# Patient Record
Sex: Male | Born: 1950
Health system: Southern US, Community
[De-identification: ages and names within clinical notes are randomized; demographics above are authoritative.]

## PROBLEM LIST (undated history)

## (undated) DIAGNOSIS — Z87442 Personal history of urinary calculi: Secondary | ICD-10-CM

## (undated) DIAGNOSIS — M1990 Unspecified osteoarthritis, unspecified site: Secondary | ICD-10-CM

## (undated) DIAGNOSIS — Z8739 Personal history of other diseases of the musculoskeletal system and connective tissue: Secondary | ICD-10-CM

## (undated) DIAGNOSIS — K219 Gastro-esophageal reflux disease without esophagitis: Secondary | ICD-10-CM

## (undated) DIAGNOSIS — I10 Essential (primary) hypertension: Secondary | ICD-10-CM

## (undated) DIAGNOSIS — C61 Malignant neoplasm of prostate: Secondary | ICD-10-CM

## (undated) DIAGNOSIS — E785 Hyperlipidemia, unspecified: Secondary | ICD-10-CM

## (undated) HISTORY — PX: SHOULDER SURGERY: SHX246

## (undated) HISTORY — PX: OTHER SURGICAL HISTORY: SHX169

## (undated) HISTORY — PX: TONSILLECTOMY: SUR1361

---

## 2001-07-18 ENCOUNTER — Ambulatory Visit (HOSPITAL_COMMUNITY): Admission: RE | Admit: 2001-07-18 | Discharge: 2001-07-18 | Payer: Self-pay

## 2001-07-19 ENCOUNTER — Ambulatory Visit (HOSPITAL_COMMUNITY): Admission: RE | Admit: 2001-07-19 | Discharge: 2001-07-19 | Payer: Self-pay

## 2001-09-09 ENCOUNTER — Ambulatory Visit (HOSPITAL_COMMUNITY): Admission: RE | Admit: 2001-09-09 | Discharge: 2001-09-09 | Payer: Self-pay | Admitting: Orthopaedic Surgery

## 2001-09-09 ENCOUNTER — Encounter: Payer: Self-pay | Admitting: Orthopaedic Surgery

## 2002-02-25 ENCOUNTER — Ambulatory Visit (HOSPITAL_COMMUNITY): Admission: RE | Admit: 2002-02-25 | Discharge: 2002-02-25 | Payer: Self-pay | Admitting: Orthopaedic Surgery

## 2002-02-25 ENCOUNTER — Encounter: Payer: Self-pay | Admitting: Orthopaedic Surgery

## 2002-10-01 ENCOUNTER — Encounter: Payer: Self-pay | Admitting: Orthopaedic Surgery

## 2002-10-01 ENCOUNTER — Ambulatory Visit (HOSPITAL_COMMUNITY): Admission: RE | Admit: 2002-10-01 | Discharge: 2002-10-01 | Payer: Self-pay | Admitting: Orthopaedic Surgery

## 2002-11-24 ENCOUNTER — Ambulatory Visit (HOSPITAL_COMMUNITY): Admission: RE | Admit: 2002-11-24 | Discharge: 2002-11-24 | Payer: Self-pay | Admitting: Internal Medicine

## 2009-04-13 ENCOUNTER — Encounter (INDEPENDENT_AMBULATORY_CARE_PROVIDER_SITE_OTHER): Payer: Self-pay | Admitting: *Deleted

## 2009-04-13 DIAGNOSIS — I10 Essential (primary) hypertension: Secondary | ICD-10-CM | POA: Insufficient documentation

## 2009-04-13 DIAGNOSIS — E119 Type 2 diabetes mellitus without complications: Secondary | ICD-10-CM

## 2009-04-20 ENCOUNTER — Ambulatory Visit: Payer: Self-pay | Admitting: Internal Medicine

## 2009-04-20 DIAGNOSIS — T8450XA Infection and inflammatory reaction due to unspecified internal joint prosthesis, initial encounter: Secondary | ICD-10-CM

## 2009-04-20 DIAGNOSIS — A4901 Methicillin susceptible Staphylococcus aureus infection, unspecified site: Secondary | ICD-10-CM | POA: Insufficient documentation

## 2009-04-20 DIAGNOSIS — M1990 Unspecified osteoarthritis, unspecified site: Secondary | ICD-10-CM | POA: Insufficient documentation

## 2009-04-20 LAB — CONVERTED CEMR LAB
BUN: 11 mg/dL (ref 6–23)
CO2: 26 meq/L (ref 19–32)
CRP: 0.3 mg/dL (ref <0.6)
Calcium: 9.6 mg/dL (ref 8.4–10.5)
Chloride: 103 meq/L (ref 96–112)
Creatinine, Ser: 1.29 mg/dL (ref 0.40–1.50)
Glucose, Bld: 137 mg/dL — ABNORMAL HIGH (ref 70–99)
HCT: 43.5 % (ref 39.0–52.0)
Hemoglobin: 14.7 g/dL (ref 13.0–17.0)
MCHC: 33.8 g/dL (ref 30.0–36.0)
MCV: 86 fL (ref 78.0–100.0)
Platelets: 236 10*3/uL (ref 150–400)
Potassium: 4.3 meq/L (ref 3.5–5.3)
RBC: 5.06 M/uL (ref 4.22–5.81)
RDW: 14.8 % (ref 11.5–15.5)
Sed Rate: 2 mm/hr (ref 0–16)
Sodium: 140 meq/L (ref 135–145)
WBC: 6.9 10*3/uL (ref 4.0–10.5)

## 2009-05-04 ENCOUNTER — Ambulatory Visit: Payer: Self-pay | Admitting: Internal Medicine

## 2009-05-04 LAB — CONVERTED CEMR LAB
CRP: 0.4 mg/dL (ref <0.6)
Sed Rate: 6 mm/hr (ref 0–16)

## 2009-05-19 ENCOUNTER — Telehealth: Payer: Self-pay | Admitting: Internal Medicine

## 2009-07-06 ENCOUNTER — Ambulatory Visit: Payer: Self-pay | Admitting: Internal Medicine

## 2009-08-04 ENCOUNTER — Telehealth (INDEPENDENT_AMBULATORY_CARE_PROVIDER_SITE_OTHER): Payer: Self-pay | Admitting: *Deleted

## 2009-09-10 ENCOUNTER — Encounter: Payer: Self-pay | Admitting: Internal Medicine

## 2009-10-20 ENCOUNTER — Encounter: Payer: Self-pay | Admitting: Internal Medicine

## 2009-11-09 ENCOUNTER — Ambulatory Visit: Payer: Self-pay | Admitting: Internal Medicine

## 2009-11-09 LAB — CONVERTED CEMR LAB
CRP: 1.1 mg/dL — ABNORMAL HIGH (ref <0.6)
Sed Rate: 2 mm/hr (ref 0–16)

## 2009-11-22 ENCOUNTER — Telehealth: Payer: Self-pay | Admitting: Internal Medicine

## 2010-02-01 NOTE — Consult Note (Signed)
Summary: [facility] Ortho. Clinic Note  [facility] Ortho. Clinic Note   Imported By: Florinda Marker 09/24/2009 09:32:31  _____________________________________________________________________  External Attachment:    Type:   Image     Comment:   External Document

## 2010-02-01 NOTE — Consult Note (Signed)
Summary: [facility]  [facility]   Imported By: Florinda Marker 11/05/2009 15:07:11  _____________________________________________________________________  External Attachment:    Type:   Image     Comment:   External Document

## 2010-02-01 NOTE — Progress Notes (Signed)
  Phone Note From Other Clinic   Caller: Dr. Loretha Stapler Summary of Call: I spoke with Dr. Lorette Ang about mr. Sauls's difficult situation. I will watch him carefully off Abs. If he relapses I will reconsider a trial of longterm suppressive Abs vs complex surgery. Initial call taken by: Cliffton Asters MD,  November 22, 2009 2:27 PM

## 2010-02-01 NOTE — Progress Notes (Signed)
Summary: Prior authorization not needed for Florastor  Phone Note From Pharmacy   [facility] [address] [phone]* Request: Needs authorization from insurer Summary of Call: received a fax prior authorization request for patient's Florastor 250mg  that is not covered by patient insurance plan.  Please have MD to call [facility] at [phone] to initate the prior authorization.  [facility] with the decision the insurance gave whether approved or denied.  Initial call taken by: Paulo Fruit  BS,CPht II,MPH,  May 19, 2009 10:25 AM  Follow-up for Phone Call        contacted patients insurance using the info the pharmacy sent to [place] and it is for workmanscomp claims.    I contacted the pharmacy to let them know they ran medciation under workmanscomp.  I spoke to [place], [place] who is going to research it and call or fax [place] something else to get this resolved. Follow-up by: Paulo Fruit  BS,CPht II,MPH,  May 19, 2009 10:36 AM  Additional Follow-up for Phone Call Additional follow up Details #1::        Scott the pharmacist did leave me a message this afternoon stating that insurance on file for him is workmanscomp disablility which will not pay for this medicaiton.  Do you want to change it to anything different or have patient try to obtain it on his on.  There is no program for this drug. As for now the pharmacist is going to fill as is. Additional Follow-up by: Paulo Fruit  BS,CPht II,MPH,  May 19, 2009 2:40 PM    Additional Follow-up for Phone Call Additional follow up Details #2::    Pt. called.  Requested that office contact Camillo Flaming w/ Lucienne Capers, [employer]. to discuss why Dr. Orvan Falconer prescribed Florastor.  RN spoke w/ Mr. Demchak [phone]].  He agreed that a copy of Dr. Blair Dolphin OV notes describing the need for the medication would be sufficient documentation for his company to review to authorize the medication.  RN faxed to OV notes to Mr.  Demchak [phone]]. Jennet Maduro RN  May 21, 2009 10:07 AM

## 2010-02-01 NOTE — Assessment & Plan Note (Signed)
Summary: new pt osteomeyltiis   CC:  right shoulder VAC dressing since 2/11.  History of Present Illness: Willie Oneal is a 60 year old who works at the [employer] plant in [employer] who is referred to me by Willie Oneal for evaluation of a right shoulder infection.  Willie Oneal injured his right shoulder on the job and required a right shoulder arthroplasty in 1999.  He said he developed a postoperative staph infection and was treated for a prolonged period with some oral antibiotic.  He required a second surgery in 2000 it sounds like an incision and drainage procedure.  He also says that his glenoid was removed during that second surgery.That was followed by a prolonged period of intravenous antibiotics and after that point he appeared to have been cured.  Last October he recalls bumping his right shoulder on a kitchen cabinet.  He developed a large bruise followed by a blood blister that he popped with a sterilized needle.  The area would not heal so he saw the doctor Willie Oneal, his primary care physician and was started on doxycycline.  He thinks he may have been on one other antibiotic as well.  The area failed to get any better and she started to get worse so he was referred to Willie Oneal at the wound center in [place].  Willie Oneal an initial note 02/04/09 describes a draining sinus in his right shoulder. He underwent incision and drainage and he tells me that Willie Oneal said there was a pocket of infection in that tract right down to the prosthesis.  Operative Gram stain and cultures were negative but he was on doxycycline at the time of the surgery.  Postoperatively he has been treated with doxycycline and more recently Septra.  He has had a back wound system placed and says the wound is getting smaller.  He has also been receiving hyperbaric oxygen therapy.  He saw Willie Oneal, his orthopedic surgeon at [facility], last week and recalls being told that there was nothing  that Willie Oneal would do differently at this point.A swab culture of the wound done at the wound Center on February 21 was culture negative again.  Third culture on March 21 grew MRSA and group B strep.  Plain x-rays of the shoulder did not show any evidence of osteomyelitis or loosening of the prosthesis.  A recent bone scan did not indicate any unusual increased uptake suggestive of abscess or osteomyelitis.  Preventive Screening-Counseling & Management  Alcohol-Tobacco     Alcohol drinks/day: 0     Smoking Status: never  Caffeine-Diet-Exercise     Caffeine use/day: yes     Does Patient Exercise: no  Safety-Violence-Falls     Seat Belt Use: yes   Current Allergies (reviewed today): No known allergies  Review of Systems  The patient denies anorexia, fever, weight loss, and weight gain.    Vital Signs:  Patient profile:   59 year old male Height:      70 inches (177.80 cm) Weight:      211.5 pounds (96.14 kg) BMI:     30.46 Temp:     97.7 degrees F (36.50 degrees C) oral Pulse rate:   82 / minute BP sitting:   127 / 80  (left arm) Cuff size:   large  Vitals Entered By: Jennet Maduro RN (April 20, 2009 2:05 PM) CC: right shoulder VAC dressing since 2/11 Is Patient Diabetic? Yes Did you bring your meter with you today?  not checked today Pain Assessment Patient in pain? no      Nutritional Status BMI of > 30 = obese Nutritional Status Detail appetite "good"  Have you ever been in a relationship where you felt threatened, hurt or afraid?not asked today   Does patient need assistance? Functional Status Self care Ambulation Normal   Physical Exam  General:  alert and overweight-appearing.   Lungs:  normal breath sounds, no crackles, and no wheezes.   Heart:  normal rate, regular rhythm, and no murmur.   Extremities:  he has an old healed incision along his right upper arm and a more recent healing incision laterally.  He has a VAC dressing in  place.   Impression & Recommendations:  Problem # 1:  INF&INFLAM REACTION DUE INTRL JOINT PROSTHESIS (ZOX-096.04) Willie Oneal has a complicated wound infection that, by his history involves his right shoulder prosthesis.  Fortunately there is no evidence of osteomyelitis or loosening of the prosthesis.  I am not sure if the MRSA and group B strep are the culprits but I will target them with expanded antibiotic therapy for now. Both are susceptible to clindamycin so I will use that as the primary agent and add rifampin as it probably has synergistic activity against the organisms particularly in the presence of hardware.  Medications Added to Medication List This Visit: 1)  Cleocin 150 Mg Caps (Clindamycin hcl) .... Take 3 capsules by mouth three times a day 2)  Rifadin 300 Mg Caps (Rifampin) .... Take 1 capsule by mouth two times a day  Other Orders: Consultation Level IV (54098) T-Basic Metabolic Panel [id number]) T-CBC No Diff ([id number]) T-C-Reactive Protein ([id number]) T-Sed Rate (Automated) ([id number])  Patient Instructions: 1)  Please schedule a follow-up appointment on May 3 at 9:45 AM.

## 2010-02-01 NOTE — Assessment & Plan Note (Signed)
Summary: f/u ov/vs   CC:  follow-up visit.  History of Present Illness: Willie Oneal is in for his follow-up visit.  He started clindamycin and rifampin two weeks ago and has had no problems tolerating them.  He feels about the same and says that his wound VAC dressing remains about the same size.  He is going to the wound center daily.  Preventive Screening-Counseling & Management  Alcohol-Tobacco     Alcohol drinks/day: 0     Smoking Status: never  Caffeine-Diet-Exercise     Caffeine use/day: yes     Does Patient Exercise: no  Safety-Violence-Falls     Seat Belt Use: yes   Prior Medication List:  CLEOCIN 150 MG CAPS (CLINDAMYCIN HCL) Take 3 capsules by mouth three times a day RIFADIN 300 MG CAPS (RIFAMPIN) Take 1 capsule by mouth two times a day OMEPRAZOLE 20 MG CPDR (OMEPRAZOLE) Take 1 capsule by mouth once a day per PCP ATENOLOL 25 MG TABS (ATENOLOL) Take 1 tablet by mouth once a day METFORMIN HCL 500 MG TABS (METFORMIN HCL) Take 1 tablet by mouth once a day per PCP MULTIVITAMINS  TABS (MULTIPLE VITAMIN) Take 1 tablet by mouth once a day per PCP ZINC 50 MG TABS (ZINC) Take 1 tablet by mouth once a day per PCP SELENIUM 200 MCG TABS (SELENIUM) Take 1 tablet by mouth once a day per PCP   Current Allergies (reviewed today): No known allergies  Vital Signs:  Patient profile:   60 year old male Height:      70 inches (177.80 cm) Weight:      213.5 pounds (97.05 kg) BMI:     30.74 Temp:     97.7 degrees F (36.50 degrees C) oral Pulse rate:   82 / minute BP sitting:   137 / 81  (left arm) Cuff size:   large  Vitals Entered By: Jennet Maduro RN (May 04, 2009 9:44 AM) CC: follow-up visit Is Patient Diabetic? Yes Pain Assessment Patient in pain? no      Nutritional Status BMI of 25 - 29 = overweight  Have you ever been in a relationship where you felt threatened, hurt or afraid?not asked   Does patient need assistance? Functional Status Self care Ambulation  Normal   Physical Exam  General:  alert and overweight-appearing.   Extremities:  he has an old healed incision along his right upper arm and a more recent healing incision laterally.  He has a VAC dressing in place. The visible sponge is about the size of a silver dollar.   Impression & Recommendations:  Problem # 1:  INF&INFLAM REACTION DUE INTRL JOINT PROSTHESIS (ICD-996.66) I am treating him for presumed MRSA and group B strep infection of his right shoulder prosthesis.  I will add a Florastor probiotic therapy to his antibiotic regimen in an attempt to decrease his risk for C. difficile colitis while on antibiotics.  I will check a sed rate and C. reactive protein today. The exact optimal duration of antibiotic therapy is unknown at this time. Orders: New Patient Level III (04540) T-C-Reactive Protein ([id number]) T-Sed Rate (Automated) [id number])  Medications Added to Medication List This Visit: 1)  Florastor 250 Mg Caps (Saccharomyces boulardii) .... Take 2 capsules by mouth two times a day  Patient Instructions: 1)  Please schedule a follow-up appointment in 6 weeks. Prescriptions: FLORASTOR 250 MG CAPS (SACCHAROMYCES BOULARDII) Take 2 capsules by mouth two times a day  #120 x 5   Entered and Authorized  by:   Cliffton Asters MD   Signed by:   Cliffton Asters MD on 05/04/2009   Method used:   [facility]        [facility] [phone]* (retail)       [address]       [place], [name]  [address]       Ph: [phone] or [phone]       Fax: [phone]   RxID:   [id number]

## 2010-02-01 NOTE — Miscellaneous (Signed)
Summary: Problems, Medications and Allergies updated  Clinical Lists Changes  Problems: Added new problem of CHRONIC OSTEOMYELITIS SHOULDER REGION (ICD-730.11) - refractory Added new problem of CHRONIC ULCER OF OTHER SPECIFIED SITE (ICD-707.8) - right shoulder Added new problem of DM (ICD-250.00) Added new problem of HYPERTENSION (ICD-401.9) Medications: Added new medication of DOXYCYCLINE HYCLATE 100 MG CAPS (DOXYCYCLINE HYCLATE) Take 1 capsule by mouth two times a day per PCP Added new medication of OMEPRAZOLE 20 MG CPDR (OMEPRAZOLE) Take 1 capsule by mouth once a day per PCP Added new medication of ATENOLOL 25 MG TABS (ATENOLOL) Take 1 tablet by mouth once a day Added new medication of METFORMIN HCL 500 MG TABS (METFORMIN HCL) Take 1 tablet by mouth once a day per PCP Added new medication of MULTIVITAMINS  TABS (MULTIPLE VITAMIN) Take 1 tablet by mouth once a day per PCP Added new medication of ZINC 50 MG TABS (ZINC) Take 1 tablet by mouth once a day per PCP Added new medication of SELENIUM 200 MCG TABS (SELENIUM) Take 1 tablet by mouth once a day per PCP Added new medication of SEPTRA DS 800-160 MG TABS (SULFAMETHOXAZOLE-TRIMETHOPRIM) Take 1 tablet by mouth two times a day per PCP Observations: Added new observation of NKA: T (04/13/2009 12:44)

## 2010-02-01 NOTE — Assessment & Plan Note (Signed)
Summary: F/U [MKJ]   CC:  follow-up visit infection right shoulder.  History of Present Illness: Mr. Genova is in for his routine visit today.  He continues to take clindamycin and rifampin for his MRSA and group B strep right prosthetic shoulder infection.  He has now been on those antibiotics for about 6 months.  Fortunately, he has not had any problems tolerating them.  He is taking florist store probiotics and has not had any problem with diarrhea.  He is not had any nausea, vomiting, anorexia or fever.  His right shoulder incision has fully healed and stopped draining.  He has been released from the wound center by Dr. Tanda Rockers.  Preventive Screening-Counseling & Management  Alcohol-Tobacco     Alcohol drinks/day: 0     Smoking Status: never  Caffeine-Diet-Exercise     Caffeine use/day: yes     Does Patient Exercise: no  Safety-Violence-Falls     Seat Belt Use: yes   Prior Medication List:  CLEOCIN 150 MG CAPS (CLINDAMYCIN HCL) Take 3 capsules by mouth three times a day RIFADIN 300 MG CAPS (RIFAMPIN) Take 1 capsule by mouth two times a day OMEPRAZOLE 20 MG CPDR (OMEPRAZOLE) Take 1 capsule by mouth once a day per PCP ATENOLOL 25 MG TABS (ATENOLOL) Take 1 tablet by mouth once a day METFORMIN HCL 500 MG TABS (METFORMIN HCL) Take 1 tablet by mouth once a day per PCP MULTIVITAMINS  TABS (MULTIPLE VITAMIN) Take 1 tablet by mouth once a day per PCP ZINC 50 MG TABS (ZINC) Take 1 tablet by mouth once a day per PCP SELENIUM 200 MCG TABS (SELENIUM) Take 1 tablet by mouth once a day per PCP FLORASTOR 250 MG CAPS (SACCHAROMYCES BOULARDII) Take 2 capsules by mouth two times a day   Current Allergies (reviewed today): No known allergies  Vital Signs:  Patient profile:   60 year old male Height:      70 inches (177.80 cm) Weight:      199.0 pounds (90.45 kg) BMI:     28.66 Temp:     98.1 degrees F (36.72 degrees C) oral Pulse rate:   82 / minute BP sitting:   128 / 71  (left  arm)  Vitals Entered By: Wendall Mola CMA Duncan Dull) (November 09, 2009 10:17 AM) CC: follow-up visit infection right shoulder Is Patient Diabetic? Yes Did you bring your meter with you today? No Pain Assessment Patient in pain? no      Nutritional Status BMI of 25 - 29 = overweight Nutritional Status Detail appetite "good"  Have you ever been in a relationship where you felt threatened, hurt or afraid?No   Does patient need assistance? Functional Status Self care Ambulation Normal Comments per pt. has missed two doses of antibiotic   Physical Exam  General:  alert and well-nourished.   Extremities:  he has an old healed incision along his right upper arm and a more recent healing incision laterally.  the incisions are fully healed without any evidence of inflammation or drainage.   Impression & Recommendations:  Problem # 1:  INF&INFLAM REACTION DUE INTRL JOINT PROSTHESIS (ICD-996.66) His incisions have healed but I cannot be certain that his infection is cured. I have told him that the only real test of cure is to stop all antibiotics and watch how he does over two to 3 months.  Because of the significant risk of side effects, particularly Clostridium difficile diarrhea, associated with clindamycin I have recommended that he stop  the antibiotics now.  His sed rate and C. reactive protein were normal in May but I will repeat those labs today.  He knows to call me if he has any signs of recurrent infection between now and his next visit.  If he does have evidence of recurrence then we will need to talk to his orthopedic surgeons at [facility] regarding options for potential cure with removal of the prosthesis versus long term suppression. Orders: Est. Patient Level III (16[address]) T-C-Reactive Protein ([id number]) T-Sed Rate (Automated) ([id number])  Patient Instructions: 1)  Please schedule a follow-up appointment in 3 months.   Immunization History:  Influenza Immunization  History:    Influenza:  historical (10/20/2009)

## 2010-02-01 NOTE — Assessment & Plan Note (Signed)
Summary: 6 wks f/u [mkj]   CC:  follow-up visit.  History of Present Illness: Mr. Macauley has now been on oral clindamycin and rifampin for nearly 3 months for his right prosthetic shoulder infection.  He says he's had no problems tolerating the antibiotics.  He has minimal right shoulder pain.  He continues to go to the wound care center and have the balloon pack 3 times each week.  The wound is much smaller but he says Dr. Tanda Rockers can still probe down to the prosthesis.  Preventive Screening-Counseling & Management  Alcohol-Tobacco     Alcohol drinks/day: 0     Smoking Status: never  Caffeine-Diet-Exercise     Caffeine use/day: yes     Does Patient Exercise: no  Safety-Violence-Falls     Seat Belt Use: yes   Current Allergies (reviewed today): No known allergies  Vital Signs:  Patient profile:   60 year old male Height:      70 inches (177.80 cm) Weight:      212.5 pounds (96.59 kg) BMI:     30.60 Temp:     98.2 degrees F (36.78 degrees C) oral Pulse (ortho):   80 / minute BP sitting:   121 / 76  (left arm) Cuff size:   large  Vitals Entered By: Jennet Maduro RN (July 06, 2009 10:40 AM) CC: follow-up visit Is Patient Diabetic? Yes Did you bring your meter with you today? not chedked Pain Assessment Patient in pain? no      Nutritional Status BMI of > 30 = obese Nutritional Status Detail appetite "FINE"  Have you ever been in a relationship where you felt threatened, hurt or afraid?No   Does patient need assistance? Functional Status Self care Ambulation Normal   Physical Exam  Extremities:  he has an old healed incision along his right upper arm and a more recent healing incision laterally.  The Vac dressing has been removed and the wound which is much smaller now is packed with Steri-Strips.  The Steri-Strips are soaked with bright red blood which he says occurred after they changed the dressing this morning.   Impression & Recommendations:  Problem # 1:   INF&INFLAM REACTION DUE INTRL JOINT PROSTHESIS (ICD-996.66) His wound is improving but I am concerned that still probes deep to the prosthesis.  I told him that it will be difficult to completely cure this infection but since he is tolerating his oral antibiotic regimen I have suggested that we continue it at least 3 more months.  He knows that if his infection recurs after completing 6 months of therapy it will almost certainly require removal of the prosthesis to affect her chair. Orders: Est. Patient Level III (16109)  Patient Instructions: 1)  Please schedule a follow-up appointment in 3 months.

## 2010-02-01 NOTE — Progress Notes (Signed)
Summary: new pharmacy  Phone Note From Pharmacy   Caller: [facility]  Details for Reason: need 90-day prescriptions Summary of Call: Received a message and fax from [facility] mail order pharmacy. This will be the new mail order pharmacy for patient.  They are requesting a 90 day supply of the Rifampin and Clindamycin to be called into them.  When I spoke to representative; she was told that originally the [facility]  Since then, the patient must use mail order.   Initial call taken by: Paulo Fruit  BS,CPht II,MPH,  August 04, 2009 4:19 PM  Follow-up for Phone Call        Received another message from [facility] (new pharmacy for patient) This time they left a phone number where we can call in the request.  It is [phone].  They also need doctor's DEA number when calling. Follow-up by: Paulo Fruit  BS,CPht II,MPH,  August 06, 2009 11:01 AM  Additional Follow-up for Phone Call Additional follow up Details #1::        I tried to call in the prescriptions for the patient at the phone number the pharmacy left. The representative was not able to locate patient in their system and said that the patient was not active in the system yet.  I told her that we will wait until Dr. Orvan Falconer signs the request form and fax it in on Monday next week 08/09/09. Additional Follow-up by: Paulo Fruit  BS,CPht II,MPH,  August 06, 2009 11:07 AM    Prescriptions: RIFADIN 300 MG CAPS (RIFAMPIN) Take 1 capsule by mouth two times a day  #180 x 1   Entered by:   Paulo Fruit  BS,CPht II,MPH   Authorized by:   Cliffton Asters MD   Signed by:   Paulo Fruit  BS,CPht II,MPH on 08/11/2009   Method used:   Telephoned to ...       [facility] Environmental education officer)       [address] [address]       [place], [place]  [address]       Ph: [phone]       Fax: [phone]   RxID:   [id number] CLEOCIN 150 MG CAPS (CLINDAMYCIN HCL) Take 3 capsules by mouth three times a day  #810 x  1   Entered by:   Paulo Fruit  BS,CPht II,MPH   Authorized by:   Cliffton Asters MD   Signed by:   Paulo Fruit  BS,CPht II,MPH on 08/11/2009   Method used:   Telephoned to ...       [facility] Environmental education officer)       [address] [address]       [place], [place]  [address]       Ph: [phone]       Fax: [phone]   RxID:   [id number]  The correct fax number is [phone].  this was manually faxed 08/11/09 Paulo Fruit  BS,CPht II,MPH  August 11, 2009 11:36 AM

## 2010-02-01 NOTE — Consult Note (Signed)
Summary: [facility]  [facility]   Imported By: Florinda Marker 04/29/2009 14:49:11  _____________________________________________________________________  External Attachment:    Type:   Image     Comment:   External Document

## 2010-03-10 ENCOUNTER — Encounter: Payer: Self-pay | Admitting: Licensed Clinical Social Worker

## 2010-03-18 ENCOUNTER — Ambulatory Visit (HOSPITAL_COMMUNITY)
Admission: RE | Admit: 2010-03-18 | Discharge: 2010-03-18 | Disposition: A | Discharge status: Home / Self Care | Payer: BC Managed Care – PPO | Source: Ambulatory Visit | Attending: General Surgery | Admitting: General Surgery

## 2010-03-18 ENCOUNTER — Other Ambulatory Visit (HOSPITAL_COMMUNITY): Payer: Self-pay | Admitting: General Surgery

## 2010-03-18 DIAGNOSIS — IMO0002 Reserved for concepts with insufficient information to code with codable children: Secondary | ICD-10-CM

## 2010-03-18 DIAGNOSIS — M25569 Pain in unspecified knee: Secondary | ICD-10-CM | POA: Insufficient documentation

## 2010-05-20 NOTE — H&P (Signed)
NAME:  Willie Oneal, Willie Oneal                         ACCOUNT NO.:  [id number]   MEDICAL RECORD NO.:  [id number]                  PATIENT TYPE:   LOCATION:                                       FACILITY:   PHYSICIAN:  Lionel December, M.D.                 DATE OF BIRTH:  [birth year]   DATE OF ADMISSION:  DATE OF DISCHARGE:                                HISTORY & PHYSICAL   REQUESTING PHYSICIAN:  Patrica Duel, M.D.   CHIEF COMPLAINT:  Hematochezia.   HISTORY OF PRESENT ILLNESS:  Willie Oneal is a 60 year old Caucasian male  who presents to our office for evaluation of intermittent moderate-volume  hematochezia.  Willie Oneal reports he has had two to three episodes in the  last six months of post-defecation finding of bright red blood as he wipes  on the toilet paper.  He denies any pain.  He is 60 years old and has never  had colonoscopy.  He reports bowel movements every day or every other day  which are typically soft and formed and brown.  He denies any melena.  He  does have a history of hemorrhoids.  He denies any constipation or diarrhea.  He does take Nexium for GERD, which he feels is well controlled.  He denies  any history of peptic ulcer disease.  He denies any dyspepsia, odynophagia,  or dysphagia.  He denies any nausea or vomiting.   PAST MEDICAL HISTORY:  1. Hypertension.  2. GERD.   PAST SURGICAL HISTORY:  1. Right shoulder replacement.  2. Left shoulder surgery.   CURRENT MEDICATIONS:  1. Bisoprolol 5 mg daily.  2. Nexium 40 mg daily.   ALLERGIES:  SULFA.   FAMILY HISTORY:  No known family history of colorectal carcinoma.  He does  have father with cirrhosis which Willie Oneal reports is due to hepatitis;  however, he does not know which strain.  Both parents are alive.  He has one  sister, in good health.   SOCIAL HISTORY:  Willie Oneal has been married 13 years.  He has a son, 27, and  two daughters, 23 and 47, all of whom are in good health.  He is employed  full-time at [employer].  He currently does not smoke; however, he  does report a 30-year history of smoking approximately one pack per day and  quitting five years ago.  He denies any alcohol or drug use.   REVIEW OF SYSTEMS:  CONSTITUTIONAL:  Weight is stable.  Appetite is good.  He denies any fever or chills.  CARDIOVASCULAR:  Denies any chest pain or  palpitations.  PULMONARY:  Denies any shortness of breath or hemoptysis or  cough.  HEMATOLOGIC:  Denies any blood dyscrasias.  SKIN:  Denies any rash  or jaundice.   PHYSICAL EXAM:  VITAL SIGNS:  Weight 208 pounds, height 71 inches.  Temperature 97, blood pressure 120/80, pulse 70.Marland Kitchen  GENERAL:  Willie Oneal is a 60 year old Caucasian male who is well-  developed, well-nourished, overweight, and in no acute distress.  He is  alert, pleasant, oriented, and cooperative.  HEENT:  Sclerae clear, nonicteric.  Conjunctivae pink.  Oropharynx pink and  moist, without any lesions.  NECK:  Supple.  Without mass or thyromegaly.  CHEST:  Heart regular rate and rhythm without murmurs, clicks, rubs, or  gallop.  LUNGS:  Clear to auscultation bilaterally.  ABDOMEN:  Positive bowel sounds x4.  Soft, obese, nontender, nondistended.  No palpable organomegaly.  RECTAL:  He does have about a 1.5 cm in length external hemorrhoid at  approximately 9 o'clock.  It does not appear thrombosed or erythematous.  He  has good sphincter tone, and a small amount of light brown heme-negative  stool was obtained.  EXTREMITIES:  With 2+ pedal pulses bilaterally.  No pedal edema.  SKIN:  Pink, warm, and dry.  Without any rash or jaundice.   ASSESSMENT:  Willie Oneal, Willie Oneal. is a 60 year old Caucasian male with three  episodes of moderate-volume intermittent hematochezia which was painless.  Willie Oneal examined today; stool was heme-negative.  However, given his  history of intermittent hematochezia as well as the fact that he will turn  60 years of age this  month, I would recommend him for screening colonoscopy.  It is questionable as to whether bleeding is coming from benign anorectal  source such as hemorrhage versus diverticular bleed.  I discussed  colonoscopy with Willie Oneal, as well as the risks and benefits which include  but are not limited to bleeding, perforation, and infection.  I also  explained that Dr. Karilyn Cota would be performing the procedure at [facility].  Willie Oneal agrees with this plan.  Consent will be obtained.   RECOMMENDATIONS:  1. Colonoscopy with Dr. Karilyn Cota to be scheduled.  2. Further recommendations and follow-up pending colonoscopy results.   We would like to thank Dr. Nobie Putnam for allowing [place] to participate in the  care of this patient.     _____________________________________  ___________________________________________  Willie Oneal, N.P.               Lionel December, M.D.   KC/MEDQ  D:  11/05/2002  T:  11/05/2002  Job:  [id number]   cc:   Patrica Duel, M.D.  [address]  [place]  [name] [address]  Fax: [phone]

## 2010-05-20 NOTE — Op Note (Signed)
NAME:  [name], [name]                           ACCOUNT NO.:  [id number]   MEDICAL RECORD NO.:  [id number]                   PATIENT TYPE:  AMB   LOCATION:  DAY                                  FACILITY:  [facility]   PHYSICIAN:  Lionel December, M.D.                 DATE OF BIRTH:  [birth year]   DATE OF PROCEDURE:  11/24/2002  DATE OF DISCHARGE:                                 OPERATIVE REPORT   PROCEDURE:  Total colonoscopy.   INDICATIONS FOR PROCEDURE:  Willie Oneal is a 60 year old Caucasian male who is  having intermittent hematochezia.  It is possibly secondary to hemorrhoids,  but given his age, he needs to have his colon examined to make sure he does  not have a neoplasm.  The procedure and risks were reviewed with the  patient, and informed consent was obtained.   PREOPERATIVE MEDICATIONS:  Demerol 50 mg IV, Versed 4 mg IV in divided dose.   FINDINGS:  The procedure was performed in the endoscopy suite.  The  patient's vital signs and O2 saturations were monitored during the procedure  and remained stable.  The patient was placed in the left lateral recumbent  position and rectal examination performed.  He had sentinel skin tags.  One  was of prominent size.  Digital exam was normal.  The Olympus videoscope was  placed into the rectum and advanced into the region of the sigmoid colon and  beyond.  The preparation was satisfactory.  The scope was passed to the  cecum which was identified by the appendiceal orifice and ileocecal valve.  Pictures were taken for the record.  As the scope was withdrawn, the colonic  mucosa was once again carefully examined and was normal throughout.  The  rectal mucosa was normal.  The scope was retroflexed to examine the  anorectal junction, and a large single hemorrhoid was noted below the  dentate line.  The endoscope was straightened and withdrawn.  The patient  tolerated the procedure well.   FINAL DIAGNOSIS:  External hemorrhoids.  Otherwise normal  colonoscopy.   RECOMMENDATIONS:  Anusol-HC cream to be used as directed.  He may consider  next screening exam in 10 years from now.      ___________________________________________                                            Lionel December, M.D.   NR/MEDQ  D:  11/24/2002  T:  11/24/2002  Job:  [id number]   cc:   Patrica Duel, M.D.  [address]  [place]  [name] [address]  Fax: [phone]

## 2011-05-01 ENCOUNTER — Inpatient Hospital Stay (HOSPITAL_COMMUNITY)
Admission: EM | Admit: 2011-05-01 | Discharge: 2011-05-04 | DRG: 551 | Disposition: A | Discharge status: Home / Self Care | Payer: BC Managed Care – PPO | Attending: Internal Medicine | Admitting: Internal Medicine

## 2011-05-01 ENCOUNTER — Emergency Department (HOSPITAL_COMMUNITY): Payer: BC Managed Care – PPO

## 2011-05-01 ENCOUNTER — Encounter (HOSPITAL_COMMUNITY): Payer: Self-pay | Admitting: *Deleted

## 2011-05-01 DIAGNOSIS — E119 Type 2 diabetes mellitus without complications: Secondary | ICD-10-CM | POA: Diagnosis present

## 2011-05-01 DIAGNOSIS — A4901 Methicillin susceptible Staphylococcus aureus infection, unspecified site: Secondary | ICD-10-CM

## 2011-05-01 DIAGNOSIS — E860 Dehydration: Secondary | ICD-10-CM | POA: Diagnosis present

## 2011-05-01 DIAGNOSIS — E872 Acidosis, unspecified: Secondary | ICD-10-CM | POA: Diagnosis present

## 2011-05-01 DIAGNOSIS — K5289 Other specified noninfective gastroenteritis and colitis: Principal | ICD-10-CM | POA: Diagnosis present

## 2011-05-01 DIAGNOSIS — K529 Noninfective gastroenteritis and colitis, unspecified: Secondary | ICD-10-CM

## 2011-05-01 DIAGNOSIS — T8450XA Infection and inflammatory reaction due to unspecified internal joint prosthesis, initial encounter: Secondary | ICD-10-CM

## 2011-05-01 DIAGNOSIS — M1990 Unspecified osteoarthritis, unspecified site: Secondary | ICD-10-CM | POA: Diagnosis present

## 2011-05-01 DIAGNOSIS — I959 Hypotension, unspecified: Secondary | ICD-10-CM | POA: Diagnosis present

## 2011-05-01 DIAGNOSIS — N179 Acute kidney failure, unspecified: Secondary | ICD-10-CM | POA: Diagnosis present

## 2011-05-01 DIAGNOSIS — R197 Diarrhea, unspecified: Secondary | ICD-10-CM | POA: Diagnosis present

## 2011-05-01 DIAGNOSIS — I10 Essential (primary) hypertension: Secondary | ICD-10-CM | POA: Diagnosis present

## 2011-05-01 HISTORY — DX: Essential (primary) hypertension: I10

## 2011-05-01 HISTORY — DX: Hyperlipidemia, unspecified: E78.5

## 2011-05-01 LAB — COMPREHENSIVE METABOLIC PANEL
BUN: 49 mg/dL — ABNORMAL HIGH (ref 6–23)
Calcium: 9.1 mg/dL (ref 8.4–10.5)
GFR calc Af Amer: 24 mL/min — ABNORMAL LOW (ref >90)
Glucose, Bld: 147 mg/dL — ABNORMAL HIGH (ref 70–99)
Total Protein: 7.5 g/dL (ref 6.0–8.3)

## 2011-05-01 LAB — CBC
HCT: 43.1 % (ref 39.0–52.0)
MCH: 27.1 pg (ref 26.0–34.0)
MCHC: 35 g/dL (ref 30.0–36.0)
RBC: 5.58 MIL/uL (ref 4.22–5.81)
WBC: 9.2 10*3/uL (ref 4.0–10.5)

## 2011-05-01 LAB — DIFFERENTIAL
Basophils Relative: 0 % (ref 0–1)
Eosinophils Absolute: 0 10*3/uL (ref 0.0–0.7)
Lymphs Abs: 1 10*3/uL (ref 0.7–4.0)
Monocytes Absolute: 2 10*3/uL — ABNORMAL HIGH (ref 0.1–1.0)
Neutro Abs: 6.2 10*3/uL (ref 1.7–7.7)

## 2011-05-01 LAB — LIPASE, BLOOD: Lipase: 31 U/L (ref 11–59)

## 2011-05-01 LAB — LACTIC ACID, PLASMA: Lactic Acid, Venous: 3.7 mmol/L — ABNORMAL HIGH (ref 0.5–2.2)

## 2011-05-01 MED ORDER — TRAZODONE HCL 50 MG PO TABS: 25.0000 mg | ORAL_TABLET | Freq: Every evening | ORAL | Status: DC | PRN

## 2011-05-01 MED ORDER — ONDANSETRON HCL 4 MG/2ML IJ SOLN: 4.0000 mg | INTRAMUSCULAR | Status: DC | PRN

## 2011-05-01 MED ORDER — SODIUM CHLORIDE 0.9 % IV BOLUS (SEPSIS)
1000.0000 mL | Freq: Once | INTRAVENOUS | Status: AC
  Administered 2011-05-01: 1000 mL via INTRAVENOUS

## 2011-05-01 MED ORDER — HYDROMORPHONE HCL PF 1 MG/ML IJ SOLN: 0.5000 mg | INTRAMUSCULAR | Status: DC | PRN

## 2011-05-01 MED ORDER — ONDANSETRON HCL 4 MG/2ML IJ SOLN
4.0000 mg | Freq: Once | INTRAMUSCULAR | Status: AC
  Administered 2011-05-01: 4 mg via INTRAVENOUS
  Filled 2011-05-01: qty 2

## 2011-05-01 MED ORDER — INSULIN ASPART 100 UNIT/ML ~~LOC~~ SOLN: 0.0000 [IU] | Freq: Every day | SUBCUTANEOUS | Status: DC

## 2011-05-01 MED ORDER — SIMVASTATIN 20 MG PO TABS
20.0000 mg | ORAL_TABLET | Freq: Every day | ORAL | Status: DC
  Administered 2011-05-02 – 2011-05-03 (×2): 20 mg via ORAL
  Filled 2011-05-01 (×2): qty 1

## 2011-05-01 MED ORDER — FLORA-Q PO CAPS
1.0000 | ORAL_CAPSULE | Freq: Every day | ORAL | Status: DC
  Administered 2011-05-02 – 2011-05-04 (×3): 1 via ORAL
  Filled 2011-05-01 (×6): qty 1

## 2011-05-01 MED ORDER — DIPHENOXYLATE-ATROPINE 2.5-0.025 MG PO TABS
1.0000 | ORAL_TABLET | Freq: Once | ORAL | Status: AC
  Administered 2011-05-01: 1 via ORAL
  Filled 2011-05-01: qty 1

## 2011-05-01 MED ORDER — POTASSIUM CHLORIDE IN NACL 20-0.9 MEQ/L-% IV SOLN
INTRAVENOUS | Status: DC
  Administered 2011-05-01 – 2011-05-03 (×6): via INTRAVENOUS
  Administered 2011-05-04: 950 mL via INTRAVENOUS

## 2011-05-01 MED ORDER — OXYCODONE HCL 5 MG PO TABS: 5.0000 mg | ORAL_TABLET | ORAL | Status: DC | PRN

## 2011-05-01 MED ORDER — METRONIDAZOLE 500 MG PO TABS
500.0000 mg | ORAL_TABLET | Freq: Three times a day (TID) | ORAL | Status: DC
  Administered 2011-05-01 – 2011-05-04 (×9): 500 mg via ORAL
  Filled 2011-05-01 (×9): qty 1

## 2011-05-01 MED ORDER — ACETAMINOPHEN 325 MG PO TABS: 650.0000 mg | ORAL_TABLET | Freq: Four times a day (QID) | ORAL | Status: DC | PRN

## 2011-05-01 MED ORDER — INSULIN ASPART 100 UNIT/ML ~~LOC~~ SOLN
0.0000 [IU] | Freq: Three times a day (TID) | SUBCUTANEOUS | Status: DC
  Administered 2011-05-04: 1 [IU] via SUBCUTANEOUS

## 2011-05-01 MED ORDER — ONDANSETRON HCL 4 MG PO TABS: 4.0000 mg | ORAL_TABLET | Freq: Four times a day (QID) | ORAL | Status: DC | PRN

## 2011-05-01 MED ORDER — ENOXAPARIN SODIUM 30 MG/0.3ML ~~LOC~~ SOLN
30.0000 mg | SUBCUTANEOUS | Status: DC
  Administered 2011-05-02 – 2011-05-03 (×2): 30 mg via SUBCUTANEOUS
  Filled 2011-05-01 (×3): qty 0.3

## 2011-05-01 MED ORDER — PANTOPRAZOLE SODIUM 40 MG PO TBEC
40.0000 mg | DELAYED_RELEASE_TABLET | Freq: Every day | ORAL | Status: DC
  Administered 2011-05-02 – 2011-05-04 (×4): 40 mg via ORAL
  Filled 2011-05-01 (×4): qty 1

## 2011-05-01 MED ORDER — ACETAMINOPHEN 650 MG RE SUPP: 650.0000 mg | Freq: Four times a day (QID) | RECTAL | Status: DC | PRN

## 2011-05-01 NOTE — ED Provider Notes (Signed)
History     CSN: [id number]  Arrival date & time 05/01/11  2010   First MD Initiated Contact with Patient 05/01/11 2101      Chief Complaint  Patient presents with  . Dizziness    (Consider location/radiation/quality/duration/timing/severity/associated sxs/prior treatment) Patient is a 61 y.o. male presenting with weakness. The history is provided by the patient. No language interpreter was used.  Weakness The primary symptoms include nausea and vomiting. Primary symptoms do not include headaches, syncope, loss of consciousness, altered mental status, seizures, dizziness, visual change, paresthesias, focal weakness, loss of sensation or fever. The symptoms began 3 to 5 days ago. The symptoms are worsening. The neurological symptoms are diffuse. Context: associated with diarrhea and vomiting.  Nausea began 3 to 5 days ago. The nausea is associated with eating. The nausea is exacerbated by food.  The vomiting began 2 days ago. Vomiting occurs 2 to 5 times per day. The emesis contains stomach contents.  Additional symptoms include weakness (generalized). Additional symptoms do not include neck stiffness, pain or lower back pain.    Past Medical History  Diagnosis Date  . Diabetes mellitus     Past Surgical History  Procedure Date  . Shoulder surgery   . Tonsillectomy     History reviewed. No pertinent family history.  History  Substance Use Topics  . Smoking status: Never Smoker   . Smokeless tobacco: Not on file  . Alcohol Use: No      Review of Systems  Constitutional: Negative for fever, chills, activity change, appetite change and fatigue.  HENT: Negative for congestion, sore throat, rhinorrhea, neck pain and neck stiffness.   Respiratory: Negative for cough and shortness of breath.   Cardiovascular: Negative for chest pain, palpitations and syncope.  Gastrointestinal: Positive for nausea, vomiting and diarrhea. Negative for abdominal pain, constipation and blood in  stool.  Genitourinary: Negative for dysuria, urgency, frequency and flank pain.  Musculoskeletal: Negative for myalgias, back pain and arthralgias.  Neurological: Positive for weakness (generalized) and light-headedness. Negative for dizziness, focal weakness, seizures, loss of consciousness, numbness, headaches and paresthesias.  Psychiatric/Behavioral: Negative for altered mental status.  All other systems reviewed and are negative.    Allergies  Review of patient's allergies indicates no known allergies.  Home Medications   Current Outpatient Rx  Name Route Sig Dispense Refill  . ATENOLOL-CHLORTHALIDONE 50-25 MG PO TABS Oral Take 1 tablet by mouth daily.    Marland Kitchen CALCIUM PO Oral Take 1,000 mg by mouth daily.    Marland Kitchen LISINOPRIL 10 MG PO TABS Oral Take 10 mg by mouth daily.    . COMPLETE SENIOR PO TABS Oral Take 1 tablet by mouth daily.    Marland Kitchen OMEPRAZOLE 20 MG PO CPDR Oral Take 20 mg by mouth daily.      Marland Kitchen SIMVASTATIN 20 MG PO TABS Oral Take 20 mg by mouth daily.    Marland Kitchen SITAGLIPTIN-METFORMIN HCL 50-1000 MG PO TABS Oral Take 1 tablet by mouth 2 (two) times daily.      BP 93/52  Pulse 114  Temp(Src) 97.4 F (36.3 C) (Oral)  Resp 16  Ht 5\' 10"  (1.778 m)  Wt 203 lb (92.08 kg)  BMI 29.13 kg/m2  SpO2 99%  Physical Exam  Nursing note and vitals reviewed. Constitutional: He is oriented to person, place, and time. He appears well-developed and well-nourished. No distress.  HENT:  Head: Normocephalic and atraumatic.  Mouth/Throat: Oropharynx is clear and moist.  Eyes: Conjunctivae and EOM are normal. Pupils are  equal, round, and reactive to light.  Neck: Normal range of motion. Neck supple.  Cardiovascular: Normal heart sounds and intact distal pulses.  Exam reveals no gallop and no friction rub.   No murmur heard.      Tachycardic rate and irreg rhythm  Pulmonary/Chest: Effort normal and breath sounds normal. No respiratory distress. He exhibits no tenderness.  Abdominal: Soft. Bowel  sounds are normal. There is no tenderness. There is no rebound and no guarding.  Musculoskeletal: Normal range of motion. He exhibits no edema and no tenderness.  Neurological: He is alert and oriented to person, place, and time. No cranial nerve deficit.  Skin: Skin is warm and dry. No rash noted.    ED Course  Procedures (including critical care time)   Date: 05/01/2011  Rate: 115  Rhythm: sinus tachycardia and premature atrial contractions (PAC)  QRS Axis: normal  Intervals: normal  ST/T Wave abnormalities: normal  Conduction Disutrbances:none  Narrative Interpretation:   Old EKG Reviewed: none available  Labs Reviewed  GLUCOSE, CAPILLARY - Abnormal; Notable for the following:    Glucose-Capillary 150 (*)    All other components within normal limits  CBC - Abnormal; Notable for the following:    MCV 77.2 (*)    RDW 16.1 (*)    All other components within normal limits  DIFFERENTIAL - Abnormal; Notable for the following:    Lymphocytes Relative 11 (*)    Monocytes Relative 22 (*)    Monocytes Absolute 2.0 (*)    All other components within normal limits  COMPREHENSIVE METABOLIC PANEL - Abnormal; Notable for the following:    Sodium 132 (*)    Chloride 95 (*)    Glucose, Bld 147 (*)    BUN 49 (*)    Creatinine, Ser 3.12 (*)    GFR calc non Af Amer 20 (*)    GFR calc Af Amer 24 (*)    All other components within normal limits  LACTIC ACID, PLASMA - Abnormal; Notable for the following:    Lactic Acid, Venous 3.7 (*)    All other components within normal limits  LIPASE, BLOOD  URINALYSIS, ROUTINE W REFLEX MICROSCOPIC   No results found.   1. Dehydration   2. Lactic acidosis   3. Acute renal failure   4. Gastroenteritis       MDM  Evidence of significant dehydration given the tachycardia, lactic acidosis, acute renal failure. He received 2 L of IV fluids the emergency department. He'll require additional IV hydration and further testing as an inpatient.  Discussed with the hospitalist who accepted the patient for permission. Patient has no abd tenderness on examination however given the persistent nausea, vomiting, diarrhea and acute abdominal series was obtained.I do not feel this is secondary to a malignant abdominal process such as mesenteric ischemia therefore no CT imaging was obtained.        Dayton Bailiff, MD 05/01/11 2222

## 2011-05-01 NOTE — ED Notes (Signed)
Feels weak, vomiting, diarrhea

## 2011-05-01 NOTE — H&P (Addendum)
PCP:  Dwana Melena, M.D.  Chief Complaint:  Diarrhea for the past 2 weeks  HPI: Willie Oneal is an 61 y.o. male.  Retired Caucasian gentleman with hypertension and diabetes; medications include an ACE inhibitor, a diuretic, metformin. He is having episodic diarrhea for the past 2 weeks it got suddenly worse over the past 4 days, over total time spent day watery and yellow no blood, telemetry started using Pepto-Bismol it decreased to about 5 times per day. He was a sudden worsening was triggered by bouts of vomiting that he had 5 days ago. He has had no further vomiting.  His only sick contact is 52-year-old grandnephew  had 2-3 days of vomiting and diarrhea which is now resolved 2 weeks ago.   Denies antibiotic use or exposure to hospitals or nursing homes  Rewiew of Systems:  The patient denies  fever, weight loss,, vision loss, decreased hearing, hoarseness, chest pain, syncope, dyspnea on exertion, peripheral edema, balance deficits, hemoptysis, abdominal pain, melena, hematochezia, severe indigestion/heartburn, hematuria, incontinence, genital sores, muscle weakness, suspicious skin lesions, transient blindness, difficulty walking, depression, unusual weight change, abnormal bleeding, enlarged lymph nodes, angioedema, and breast masses.    Past Medical History  Diagnosis Date  . Diabetes mellitus     Past Surgical History  Procedure Date  . Shoulder surgery   . Tonsillectomy     Medications:  HOME MEDS: Prior to Admission medications   Medication Sig Start Date End Date Taking? Authorizing Provider  atenolol-chlorthalidone (TENORETIC) 50-25 MG per tablet Take 1 tablet by mouth daily.   Yes Historical Provider, MD  CALCIUM PO Take 1,000 mg by mouth daily.   Yes Historical Provider, MD  lisinopril (PRINIVIL,ZESTRIL) 10 MG tablet Take 10 mg by mouth daily.   Yes Historical Provider, MD  Multiple Vitamins-Minerals (COMPLETE SENIOR) TABS Take 1 tablet by mouth daily.   Yes Historical  Provider, MD  omeprazole (PRILOSEC) 20 MG capsule Take 20 mg by mouth daily.     Yes Historical Provider, MD  simvastatin (ZOCOR) 20 MG tablet Take 20 mg by mouth daily.   Yes Historical Provider, MD  sitaGLIPtan-metformin (JANUMET) 50-1000 MG per tablet Take 1 tablet by mouth 2 (two) times daily.   Yes Historical Provider, MD     Allergies:  No Known Allergies  Social History:   reports that he has never smoked. He does not have any smokeless tobacco history on file. He reports that he does not drink alcohol or use illicit drugs.  Family History: History reviewed. No pertinent family history.   Physical Exam: Filed Vitals:   05/01/11 2047 05/01/11 2049 05/01/11 2052 05/01/11 2301  BP: 98/44 95/57 93/52  97/55  Pulse: 102 114 114 90  Temp:    97.9 F (36.6 C)  TempSrc:    Oral  Resp:    20  Height:      Weight:      SpO2:    100%   Blood pressure 97/55, pulse 90, temperature 97.9 F (36.6 C), temperature source Oral, resp. rate 20, height 5\' 10"  (1.778 m), weight 92.08 kg (203 lb), SpO2 100.00%.  GEN:  Pleasant middle-aged Caucasian gentleman lying in the stretcher in no acute distress; cooperative with exam PSYCH:  alert and oriented x4; does not appear anxious or depressed; affect is appropriate. HEENT: Mucous membranes pink, dry, and anicteric; PERRLA; EOM intact; no cervical lymphadenopathy nor thyromegaly or carotid bruit; no JVD; Breasts:: Not examined CHEST WALL: No tenderness CHEST: Normal respiration, clear to auscultation bilaterally  HEART: Regular rate and rhythm; no murmurs rubs or gallops BACK: No kyphosis or scoliosis; no CVA tenderness ABDOMEN:  soft non-tender; no masses, no organomegaly, increase abdominal bowel sounds; no pannus; no intertriginous candida. Rectal Exam: Not done EXTREMITIES: ; age-appropriate arthropathy of the hands and knees; no edema; Genitalia: not examined PULSES: 2+ and symmetric SKIN: Normal hydration no rash or ulceration CNS:  Cranial nerves 2-12 grossly intact no focal lateralizing neurologic deficit   Labs & Imaging Results for orders placed during the hospital encounter of 05/01/11 (from the past 48 hour(s))  GLUCOSE, CAPILLARY     Status: Abnormal   Collection Time   05/01/11  8:33 PM      Component Value Range Comment   Glucose-Capillary 150 (*) 70 - 99 (mg/dL)   CBC     Status: Abnormal   Collection Time   05/01/11  9:09 PM      Component Value Range Comment   WBC 9.2  4.0 - 10.5 (K/uL)    RBC 5.58  4.22 - 5.81 (MIL/uL)    Hemoglobin 15.1  13.0 - 17.0 (g/dL)    HCT 08.6  57.8 - 46.9 (%)    MCV 77.2 (*) 78.0 - 100.0 (fL)    MCH 27.1  26.0 - 34.0 (pg)    MCHC 35.0  30.0 - 36.0 (g/dL)    RDW 62.9 (*) 52.8 - 15.5 (%)    Platelets 270  150 - 400 (K/uL)   DIFFERENTIAL     Status: Abnormal   Collection Time   05/01/11  9:09 PM      Component Value Range Comment   Neutrophils Relative 67  43 - 77 (%)    Lymphocytes Relative 11 (*) 12 - 46 (%)    Monocytes Relative 22 (*) 3 - 12 (%)    Eosinophils Relative 0  0 - 5 (%)    Basophils Relative 0  0 - 1 (%)    Neutro Abs 6.2  1.7 - 7.7 (K/uL)    Lymphs Abs 1.0  0.7 - 4.0 (K/uL)    Monocytes Absolute 2.0 (*) 0.1 - 1.0 (K/uL)    Eosinophils Absolute 0.0  0.0 - 0.7 (K/uL)    Basophils Absolute 0.0  0.0 - 0.1 (K/uL)    Smear Review MORPHOLOGY UNREMARKABLE     COMPREHENSIVE METABOLIC PANEL     Status: Abnormal   Collection Time   05/01/11  9:09 PM      Component Value Range Comment   Sodium 132 (*) 135 - 145 (mEq/L)    Potassium 3.6  3.5 - 5.1 (mEq/L)    Chloride 95 (*) 96 - 112 (mEq/L)    CO2 19  19 - 32 (mEq/L)    Glucose, Bld 147 (*) 70 - 99 (mg/dL)    BUN 49 (*) 6 - 23 (mg/dL)    Creatinine, Ser 4.13 (*) 0.50 - 1.35 (mg/dL)    Calcium 9.1  8.4 - 10.5 (mg/dL)    Total Protein 7.5  6.0 - 8.3 (g/dL)    Albumin 4.3  3.5 - 5.2 (g/dL)    AST 29  0 - 37 (U/L)    ALT 23  0 - 53 (U/L)    Alkaline Phosphatase 79  39 - 117 (U/L)    Total Bilirubin 0.8  0.3 -  1.2 (mg/dL)    GFR calc non Af Amer 20 (*) >90 (mL/min)    GFR calc Af Amer 24 (*) >90 (mL/min)   LIPASE, BLOOD  Status: Normal   Collection Time   05/01/11  9:09 PM      Component Value Range Comment   Lipase 31  11 - 59 (U/L)   LACTIC ACID, PLASMA     Status: Abnormal   Collection Time   05/01/11  9:10 PM      Component Value Range Comment   Lactic Acid, Venous 3.7 (*) 0.5 - 2.2 (mmol/L)    Dg Abd Acute W/chest  05/01/2011  *RADIOLOGY REPORT*  Clinical Data: Vomiting, diarrhea.  ACUTE ABDOMEN SERIES (ABDOMEN 2 VIEW & CHEST 1 VIEW)  Comparison: None.  Findings: Hyperinflation.  No focal consolidation.  No pleural effusion or pneumothorax.  Cardiomediastinal contours within normal limits.  There is osseous deformity of the bilateral glenohumeral joints with surgical changes on the right.  Osteopenia.  Nonobstructive bowel gas pattern.  Organ outlines normal where seen.  There is no free intraperitoneal air identified.  Bilateral vas deferens calcifications.  No acute osseous abnormality.  IMPRESSION: Hyperinflation without focal consolidation.  Nonobstructive bowel gas pattern.  Vas deferens calcifications in keeping with diabetes.  Deformity of the bilateral glenohumeral joints appear chronic.  Original Report Authenticated By: Waneta Martins, M.D.      Assessment Present on Admission:  .Acute renal failure, secondary to hypovolemia, aggravated by diuretics and ACE inhibitor  .Diarrhea Lactic acidemia , associated with dehydration acute renal failure and metformin use  .DM .Hypertension .DEGENERATIVE JOINT DISEASE    PLAN: Admit this gentleman for hydration and monitoring of his kidney function, lactic acid. Check C. difficile and empirically start Flagyl Note lisinopril diuretic and metformin for the time being, Temporary Sliding scale insulin  for diabetes, and her antihypertensives since his blood pressure is borderline low.   Other plans as per  orders.  Griselda Bramblett 05/01/2011, 11:06 PM

## 2011-05-02 ENCOUNTER — Encounter (HOSPITAL_COMMUNITY): Payer: Self-pay | Admitting: Internal Medicine

## 2011-05-02 DIAGNOSIS — I959 Hypotension, unspecified: Secondary | ICD-10-CM | POA: Diagnosis present

## 2011-05-02 DIAGNOSIS — E860 Dehydration: Secondary | ICD-10-CM | POA: Diagnosis present

## 2011-05-02 DIAGNOSIS — E872 Acidosis: Secondary | ICD-10-CM | POA: Diagnosis present

## 2011-05-02 LAB — URINALYSIS, ROUTINE W REFLEX MICROSCOPIC
Bilirubin Urine: NEGATIVE
Nitrite: NEGATIVE
Specific Gravity, Urine: 1.025 (ref 1.005–1.030)
Urobilinogen, UA: 0.2 mg/dL (ref 0.0–1.0)

## 2011-05-02 LAB — CBC
HCT: 36.7 % — ABNORMAL LOW (ref 39.0–52.0)
Hemoglobin: 12.6 g/dL — ABNORMAL LOW (ref 13.0–17.0)
MCH: 26.6 pg (ref 26.0–34.0)
MCHC: 34.3 g/dL (ref 30.0–36.0)
MCV: 77.4 fL — ABNORMAL LOW (ref 78.0–100.0)
RDW: 16.2 % — ABNORMAL HIGH (ref 11.5–15.5)

## 2011-05-02 LAB — CLOSTRIDIUM DIFFICILE BY PCR: Toxigenic C. Difficile by PCR: NEGATIVE

## 2011-05-02 LAB — BASIC METABOLIC PANEL
BUN: 42 mg/dL — ABNORMAL HIGH (ref 6–23)
CO2: 21 mEq/L (ref 19–32)
Chloride: 102 mEq/L (ref 96–112)
Creatinine, Ser: 1.89 mg/dL — ABNORMAL HIGH (ref 0.50–1.35)
GFR calc Af Amer: 43 mL/min — ABNORMAL LOW (ref >90)
Glucose, Bld: 97 mg/dL (ref 70–99)
Potassium: 3.2 mEq/L — ABNORMAL LOW (ref 3.5–5.1)

## 2011-05-02 LAB — GLUCOSE, CAPILLARY: Glucose-Capillary: 111 mg/dL — ABNORMAL HIGH (ref 70–99)

## 2011-05-02 MED ORDER — POTASSIUM CHLORIDE CRYS ER 20 MEQ PO TBCR
30.0000 meq | EXTENDED_RELEASE_TABLET | Freq: Two times a day (BID) | ORAL | Status: AC
  Administered 2011-05-02 (×2): 30 meq via ORAL
  Filled 2011-05-02: qty 1
  Filled 2011-05-02: qty 3

## 2011-05-02 NOTE — Progress Notes (Signed)
Received report from Abby, RN.

## 2011-05-02 NOTE — Progress Notes (Signed)
Subjective: The patient is sitting up in bed. He had 2 loose stools this morning, but they are starting to form a little. He denies nausea and vomiting. He tolerated his breakfast without problems.  Objective: Vital signs in last 24 hours: Filed Vitals:   05/02/11 0155 05/02/11 0446 05/02/11 0607 05/02/11 1019  BP: 99/64  98/65 92/53  Pulse: 98  98 92  Temp: 97.4 F (36.3 C)  98.1 F (36.7 C) 97.9 F (36.6 C)  TempSrc: Oral  Oral Oral  Resp: 20  20 22   Height:      Weight:  96.616 kg (213 lb)    SpO2: 96%  96% 96%    Intake/Output Summary (Last 24 hours) at 05/02/11 1145 Last data filed at 05/02/11 0600  Gross per 24 hour  Intake    728 ml  Output    400 ml  Net    328 ml    Weight change:   Physical exam: Lungs: Clear to auscultation bilaterally. Heart: S1, S2, with no murmurs rubs or gallops. Abdomen: Mildly obese, positive bowel sounds, soft, nontender, nondistended. Extremities: No pedal edema.  Lab Results: Basic Metabolic Panel:  Basename 05/02/11 0601 05/01/11 2109  NA 135 132*  K 3.2* 3.6  CL 102 95*  CO2 21 19  GLUCOSE 97 147*  BUN 42* 49*  CREATININE 1.89* 3.12*  CALCIUM 7.9* 9.1  MG 2.1 --  PHOS -- --   Liver Function Tests:  [facility] 05/01/11 2109  AST 29  ALT 23  ALKPHOS 79  BILITOT 0.8  PROT 7.5  ALBUMIN 4.3    Basename 05/01/11 2109  LIPASE 31  AMYLASE --   No results found for this basename: AMMONIA:2 in the last 72 hours CBC:  Basename 05/02/11 0601 05/01/11 2109  WBC 4.7 9.2  NEUTROABS -- 6.2  HGB 12.6* 15.1  HCT 36.7* 43.1  MCV 77.4* 77.2*  PLT 205 270   Cardiac Enzymes: No results found for this basename: CKTOTAL:3,CKMB:3,CKMBINDEX:3,TROPONINI:3 in the last 72 hours BNP: No results found for this basename: PROBNP:3 in the last 72 hours D-Dimer: No results found for this basename: DDIMER:2 in the last 72 hours CBG:  Basename 05/02/11 0811 05/01/11 2033  GLUCAP 107* 150*   Hemoglobin A1C: No results found for  this basename: HGBA1C in the last 72 hours Fasting Lipid Panel: No results found for this basename: CHOL,HDL,LDLCALC,TRIG,CHOLHDL,LDLDIRECT in the last 72 hours Thyroid Function Tests: No results found for this basename: TSH,T4TOTAL,FREET4,T3FREE,THYROIDAB in the last 72 hours Anemia Panel: No results found for this basename: VITAMINB12,FOLATE,FERRITIN,TIBC,IRON,RETICCTPCT in the last 72 hours Coagulation: No results found for this basename: LABPROT:2,INR:2 in the last 72 hours Urine Drug Screen: Drugs of Abuse  No results found for this basename: labopia,  cocainscrnur,  labbenz,  amphetmu,  thcu,  labbarb    Alcohol Level: No results found for this basename: ETH:2 in the last 72 hours Urinalysis:  Basename 05/02/11 0634  COLORURINE YELLOW  LABSPEC 1.025  PHURINE 5.5  GLUCOSEU NEGATIVE  HGBUR NEGATIVE  BILIRUBINUR NEGATIVE  KETONESUR NEGATIVE  PROTEINUR NEGATIVE  UROBILINOGEN 0.2  NITRITE NEGATIVE  LEUKOCYTESUR NEGATIVE   Misc. Labs:   Micro: No results found for this or any previous visit (from the past 240 hour(s)).  Studies/Results: Dg Abd Acute W/chest  05/01/2011  *RADIOLOGY REPORT*  Clinical Data: Vomiting, diarrhea.  ACUTE ABDOMEN SERIES (ABDOMEN 2 VIEW & CHEST 1 VIEW)  Comparison: None.  Findings: Hyperinflation.  No focal consolidation.  No pleural effusion or pneumothorax.  Cardiomediastinal contours within normal limits.  There is osseous deformity of the bilateral glenohumeral joints with surgical changes on the right.  Osteopenia.  Nonobstructive bowel gas pattern.  Organ outlines normal where seen.  There is no free intraperitoneal air identified.  Bilateral vas deferens calcifications.  No acute osseous abnormality.  IMPRESSION: Hyperinflation without focal consolidation.  Nonobstructive bowel gas pattern.  Vas deferens calcifications in keeping with diabetes.  Deformity of the bilateral glenohumeral joints appear chronic.  Original Report Authenticated By:  Waneta Martins, M.D.    Medications:  Scheduled:    . diphenoxylate-atropine  1 tablet Oral Once  . enoxaparin  30 mg Subcutaneous Q24H  . Flora-Q  1 capsule Oral Daily  . insulin aspart  0-5 Units Subcutaneous QHS  . insulin aspart  0-9 Units Subcutaneous TID WC  . metroNIDAZOLE  500 mg Oral Q8H  . ondansetron  4 mg Intravenous Once  . pantoprazole  40 mg Oral Q1200  . potassium chloride  30 mEq Oral BID  . simvastatin  20 mg Oral Daily  . sodium chloride  1,000 mL Intravenous Once  . sodium chloride  1,000 mL Intravenous Once   Continuous:    . 0.9 % NaCl with KCl 20 mEq / L 150 mL/hr at 05/01/11 2344   WUJ:[id number], acetaminophen, HYDROmorphone, ondansetron (ZOFRAN) IV, ondansetron, oxyCODONE, traZODone  Assessment: Active Problems:  DM  HYPERTENSION  DEGENERATIVE JOINT DISEASE  Acute renal failure  Diarrhea  Lactic acidosis  Dehydration  Hypotension    1. Acute diarrheal illness, possibly acute gastroenteritis. C. difficile PCR is pending. He is on Flagyl empirically. He is on Flora Q.  Lactic acidosis, secondary to diarrhea in the setting of metformin use and acute renal failure. Resolved.  Relative hypotension with a history of hypertension. He is being hydrated. His antihypertensive medications are being withheld.  Acute renal failure. Secondary to hypovolemia and dehydration/prerenal azotemia.  Type 2 diabetes mellitus. His oral medication is being held. He is being treated with sliding scale NovoLog for now.  Hypokalemia, likely secondary to diarrhea.    Plan:  1. Continue IV fluid hydration. 2. Supplement potassium in the IV fluids and orally. 3. We'll check a magnesium level. 4. Continue supportive treatment. 5. Check the results of C. difficile PCR pending.   LOS: 1 day   Willie Oneal 05/02/2011, 11:45 AM

## 2011-05-03 DIAGNOSIS — R197 Diarrhea, unspecified: Secondary | ICD-10-CM

## 2011-05-03 DIAGNOSIS — E872 Acidosis: Secondary | ICD-10-CM

## 2011-05-03 DIAGNOSIS — N179 Acute kidney failure, unspecified: Secondary | ICD-10-CM

## 2011-05-03 DIAGNOSIS — E119 Type 2 diabetes mellitus without complications: Secondary | ICD-10-CM

## 2011-05-03 LAB — CBC
HCT: 36.9 % — ABNORMAL LOW (ref 39.0–52.0)
MCHC: 34.7 g/dL (ref 30.0–36.0)
MCV: 78.2 fL (ref 78.0–100.0)
RDW: 16.2 % — ABNORMAL HIGH (ref 11.5–15.5)

## 2011-05-03 LAB — BASIC METABOLIC PANEL
BUN: 21 mg/dL (ref 6–23)
CO2: 20 mEq/L (ref 19–32)
Calcium: 8.4 mg/dL (ref 8.4–10.5)
Chloride: 107 mEq/L (ref 96–112)
Creatinine, Ser: 1.16 mg/dL (ref 0.50–1.35)
GFR calc Af Amer: 78 mL/min — ABNORMAL LOW (ref >90)

## 2011-05-03 LAB — GLUCOSE, CAPILLARY: Glucose-Capillary: 107 mg/dL — ABNORMAL HIGH (ref 70–99)

## 2011-05-03 MED ORDER — CIPROFLOXACIN HCL 250 MG PO TABS
500.0000 mg | ORAL_TABLET | Freq: Two times a day (BID) | ORAL | Status: DC
  Administered 2011-05-03 – 2011-05-04 (×2): 500 mg via ORAL
  Filled 2011-05-03 (×2): qty 2

## 2011-05-03 MED ORDER — ATENOLOL 25 MG PO TABS
25.0000 mg | ORAL_TABLET | Freq: Every day | ORAL | Status: DC
  Administered 2011-05-03 – 2011-05-04 (×2): 25 mg via ORAL
  Filled 2011-05-03 (×2): qty 1

## 2011-05-03 MED ORDER — POTASSIUM CHLORIDE CRYS ER 20 MEQ PO TBCR
20.0000 meq | EXTENDED_RELEASE_TABLET | Freq: Two times a day (BID) | ORAL | Status: DC
  Administered 2011-05-03 – 2011-05-04 (×3): 20 meq via ORAL
  Filled 2011-05-03 (×3): qty 1

## 2011-05-03 MED ORDER — CHOLESTYRAMINE LIGHT 4 G PO PACK: 2.0000 g | PACK | Freq: Two times a day (BID) | ORAL | Status: DC

## 2011-05-03 MED ORDER — ENOXAPARIN SODIUM 40 MG/0.4ML ~~LOC~~ SOLN
40.0000 mg | SUBCUTANEOUS | Status: DC
  Administered 2011-05-04: 40 mg via SUBCUTANEOUS
  Filled 2011-05-03 (×2): qty 0.4

## 2011-05-03 MED ORDER — CHOLESTYRAMINE LIGHT 4 G PO PACK
2.0000 g | PACK | Freq: Two times a day (BID) | ORAL | Status: AC
  Administered 2011-05-03 (×2): 2 g via ORAL
  Filled 2011-05-03 (×3): qty 1

## 2011-05-03 NOTE — Progress Notes (Addendum)
Subjective: The patient is sitting up in bed. He had several watery stools this morning. No nausea vomiting. No abnormal cramping. Overall, he feels stronger.  Objective: Vital signs in last 24 hours: Filed Vitals:   05/02/11 1807 05/02/11 2215 05/03/11 0620 05/03/11 0741  BP: 133/74 132/86 121/74   Pulse: 95 94 92   Temp: 98.1 F (36.7 C) 97.7 F (36.5 C) 98.1 F (36.7 C)   TempSrc: Oral     Resp: 20 20 20    Height:      Weight:    94.5 kg (208 lb 5.4 oz)  SpO2: 96% 98% 100%     Intake/Output Summary (Last 24 hours) at 05/03/11 0936 Last data filed at 05/03/11 0600  Gross per 24 hour  Intake    480 ml  Output   1000 ml  Net   -520 ml    Weight change:   Physical exam: Lungs: Clear to auscultation bilaterally. Heart: S1, S2, with no murmurs rubs or gallops. Abdomen: Mildly obese, positive bowel sounds, soft, nontender, nondistended. Extremities: No pedal edema.  Lab Results: Basic Metabolic Panel:  Basename 05/03/11 0553 05/02/11 0601  NA 138 135  K 3.7 3.2*  CL 107 102  CO2 20 21  GLUCOSE 113* 97  BUN 21 42*  CREATININE 1.16 1.89*  CALCIUM 8.4 7.9*  MG 2.1 2.1  PHOS -- --   Liver Function Tests:  [facility] 05/01/11 2109  AST 29  ALT 23  ALKPHOS 79  BILITOT 0.8  PROT 7.5  ALBUMIN 4.3    Basename 05/01/11 2109  LIPASE 31  AMYLASE --   No results found for this basename: AMMONIA:2 in the last 72 hours CBC:  Basename 05/03/11 0553 05/02/11 0601 05/01/11 2109  WBC 4.0 4.7 --  NEUTROABS -- -- 6.2  HGB 12.8* 12.6* --  HCT 36.9* 36.7* --  MCV 78.2 77.4* --  PLT 202 205 --   Cardiac Enzymes: No results found for this basename: CKTOTAL:3,CKMB:3,CKMBINDEX:3,TROPONINI:3 in the last 72 hours BNP: No results found for this basename: PROBNP:3 in the last 72 hours D-Dimer: No results found for this basename: DDIMER:2 in the last 72 hours CBG:  Basename 05/03/11 0739 05/02/11 2105 05/02/11 1708 05/02/11 1248 05/02/11 0811 05/01/11 2033  GLUCAP 106*  110* 104* 111* 107* 150*   Hemoglobin A1C: No results found for this basename: HGBA1C in the last 72 hours Fasting Lipid Panel: No results found for this basename: CHOL,HDL,LDLCALC,TRIG,CHOLHDL,LDLDIRECT in the last 72 hours Thyroid Function Tests: No results found for this basename: TSH,T4TOTAL,FREET4,T3FREE,THYROIDAB in the last 72 hours Anemia Panel: No results found for this basename: VITAMINB12,FOLATE,FERRITIN,TIBC,IRON,RETICCTPCT in the last 72 hours Coagulation: No results found for this basename: LABPROT:2,INR:2 in the last 72 hours Urine Drug Screen: Drugs of Abuse  No results found for this basename: labopia,  cocainscrnur,  labbenz,  amphetmu,  thcu,  labbarb    Alcohol Level: No results found for this basename: ETH:2 in the last 72 hours Urinalysis:  Basename 05/02/11 0634  COLORURINE YELLOW  LABSPEC 1.025  PHURINE 5.5  GLUCOSEU NEGATIVE  HGBUR NEGATIVE  BILIRUBINUR NEGATIVE  KETONESUR NEGATIVE  PROTEINUR NEGATIVE  UROBILINOGEN 0.2  NITRITE NEGATIVE  LEUKOCYTESUR NEGATIVE   Misc. Labs:   Micro: Recent Results (from the past 240 hour(s))  CLOSTRIDIUM DIFFICILE BY PCR     Status: Normal   Collection Time   05/02/11  8:46 AM      Component Value Range Status Comment   C difficile by pcr NEGATIVE  NEGATIVE  Final     Studies/Results: Dg Abd Acute W/chest  05/01/2011  *RADIOLOGY REPORT*  Clinical Data: Vomiting, diarrhea.  ACUTE ABDOMEN SERIES (ABDOMEN 2 VIEW & CHEST 1 VIEW)  Comparison: None.  Findings: Hyperinflation.  No focal consolidation.  No pleural effusion or pneumothorax.  Cardiomediastinal contours within normal limits.  There is osseous deformity of the bilateral glenohumeral joints with surgical changes on the right.  Osteopenia.  Nonobstructive bowel gas pattern.  Organ outlines normal where seen.  There is no free intraperitoneal air identified.  Bilateral vas deferens calcifications.  No acute osseous abnormality.  IMPRESSION: Hyperinflation  without focal consolidation.  Nonobstructive bowel gas pattern.  Vas deferens calcifications in keeping with diabetes.  Deformity of the bilateral glenohumeral joints appear chronic.  Original Report Authenticated By: Waneta Martins, M.D.    Medications:  Scheduled:    . atenolol  25 mg Oral Daily  . cholestyramine light  2 g Oral BID  . enoxaparin  30 mg Subcutaneous Q24H  . Flora-Q  1 capsule Oral Daily  . insulin aspart  0-5 Units Subcutaneous QHS  . insulin aspart  0-9 Units Subcutaneous TID WC  . metroNIDAZOLE  500 mg Oral Q8H  . pantoprazole  40 mg Oral Q1200  . potassium chloride  30 mEq Oral BID  . potassium chloride  20 mEq Oral BID  . simvastatin  20 mg Oral Daily  . DISCONTD: cholestyramine light  2 g Oral BID   Continuous:    . 0.9 % NaCl with KCl 20 mEq / L 100 mL/hr at 05/03/11 0041   ZOX:[id number], acetaminophen, HYDROmorphone, ondansetron (ZOFRAN) IV, ondansetron, oxyCODONE, traZODone  Assessment: Active Problems:  DM  DEGENERATIVE JOINT DISEASE  Acute renal failure  Diarrhea  Lactic acidosis  Dehydration  Hypotension    1. Acute diarrheal illness, possibly acute gastroenteritis. C. difficile PCR is negative. He is on Flagyl empirically. He is on Flora Q.  Lactic acidosis, secondary to diarrhea in the setting of metformin use and acute renal failure. Resolved.  Relative hypotension with a history of hypertension. He is being hydrated. His antihypertensive medications are being withheld, but atenolol will be restarted now that his blood pressures trending up little.  Acute renal failure. Secondary to hypovolemia and dehydration/prerenal azotemia in the setting of ACE inhibitor therapy. Resolved.  Type 2 diabetes mellitus. His oral medication is being held. He is being treated with sliding scale NovoLog for now.  Hypokalemia, likely secondary to diarrhea. Supplementing. His magnesium level is within normal limits.    Plan:  1. Continue IV  fluid hydration and supportive management. 2. Will start twice a day dosing of Questran x1 day. 3. Restart atenolol at half the usual dose. 4. We'll order stool culture and ova and parasite exam. 4. Discharge this afternoon or in the morning if his diarrhea subsides.    LOS: 2 days   Isabellamarie Randa 05/03/2011, 9:36 AM   ADDENDUM: THE patient continues to have loose and watery stools per his history. A stool culture and stool ova and parasite study have been ordered. I have asked him the nursing staff to monitor the consistency and number if his stools. We'll add Cipro orally. We'll continue supportive treatment. We'll discharge the patient when he has less diarrhea and is less symptomatic, hopefully tomorrow or the next day.

## 2011-05-03 NOTE — Discharge Instructions (Signed)
B.R.A.T. Diet Your doctor has recommended the B.R.A.T. diet for you or your child until the condition improves. This is often used to help control diarrhea and vomiting symptoms. If you or your child can tolerate clear liquids, you may have:  Bananas.   Rice.   Applesauce.   Toast (and other simple starches such as crackers, potatoes, noodles).  Be sure to avoid dairy products, meats, and fatty foods until symptoms are better. Fruit juices such as apple, grape, and prune juice can make diarrhea worse. Avoid these. Continue this diet for 2 days or as instructed by your caregiver. Document Released: 12/19/2004 Document Revised: 12/08/2010 Document Reviewed: 06/07/2006 Exit[employer] Patient Information 2012 [employer]. 

## 2011-05-04 DIAGNOSIS — R197 Diarrhea, unspecified: Secondary | ICD-10-CM

## 2011-05-04 DIAGNOSIS — N179 Acute kidney failure, unspecified: Secondary | ICD-10-CM

## 2011-05-04 DIAGNOSIS — E119 Type 2 diabetes mellitus without complications: Secondary | ICD-10-CM

## 2011-05-04 DIAGNOSIS — E872 Acidosis: Secondary | ICD-10-CM

## 2011-05-04 LAB — BASIC METABOLIC PANEL
CO2: 20 mEq/L (ref 19–32)
Calcium: 8.2 mg/dL — ABNORMAL LOW (ref 8.4–10.5)
Chloride: 109 mEq/L (ref 96–112)
Creatinine, Ser: 1.05 mg/dL (ref 0.50–1.35)
Glucose, Bld: 90 mg/dL (ref 70–99)

## 2011-05-04 LAB — GLUCOSE, CAPILLARY: Glucose-Capillary: 131 mg/dL — ABNORMAL HIGH (ref 70–99)

## 2011-05-04 MED ORDER — ACETAMINOPHEN 325 MG PO TABS: 650.0000 mg | ORAL_TABLET | Freq: Four times a day (QID) | ORAL | Status: AC | PRN

## 2011-05-04 MED ORDER — CHOLESTYRAMINE 4 G PO PACK
4.0000 g | PACK | Freq: Once | ORAL | Status: AC
  Administered 2011-05-04: 4 g via ORAL
  Filled 2011-05-04: qty 1

## 2011-05-04 MED ORDER — LOPERAMIDE HCL 2 MG PO TABS: 2.0000 mg | ORAL_TABLET | ORAL | Status: AC | PRN

## 2011-05-04 MED ORDER — CIPROFLOXACIN HCL 500 MG PO TABS: 500.0000 mg | ORAL_TABLET | Freq: Two times a day (BID) | ORAL | Status: AC

## 2011-05-04 NOTE — Progress Notes (Signed)
Writer reviewed and discussed discharge instructions and medications, verbalized understanding.  Encouraged to call with nay questions that may arise.  Pt took all belongings and staff member wheel chaired out in stable condition and in no distress.  Follow up appt's in place and encouraged follow up.

## 2011-05-04 NOTE — Progress Notes (Signed)
UR Chart Review Completed  

## 2011-05-05 LAB — OVA AND PARASITE EXAMINATION

## 2011-05-06 NOTE — Discharge Summary (Signed)
Physician Discharge Summary  Patient ID: Willie Oneal MRN: [id number] DOB/AGE: Aug 02, 1950 61 y.o.  Admit date: 05/01/2011 Discharge date: 05/06/2011  Discharge Diagnoses:  Diarrhea  DM  DEGENERATIVE JOINT DISEASE  Acute renal failure  Lactic acidosis  Dehydration  Hypotension   Medication List  As of 05/06/2011  4:54 PM   STOP taking these medications         atenolol-chlorthalidone 50-25 MG per tablet         TAKE these medications         acetaminophen 325 MG tablet   Commonly known as: TYLENOL   Take 2 tablets (650 mg total) by mouth every 6 (six) hours as needed (or Fever >/= 101).      CALCIUM PO   Take 1,000 mg by mouth daily.      ciprofloxacin 500 MG tablet   Commonly known as: CIPRO   Take 1 tablet (500 mg total) by mouth 2 (two) times daily.      Complete Senior Tabs   Take 1 tablet by mouth daily.      lisinopril 10 MG tablet   Commonly known as: PRINIVIL,ZESTRIL   Take 10 mg by mouth daily.      loperamide 2 MG tablet   Commonly known as: IMODIUM A-D   Take 1 tablet (2 mg total) by mouth as needed for diarrhea or loose stools.      omeprazole 20 MG capsule   Commonly known as: PRILOSEC   Take 20 mg by mouth daily.      simvastatin 20 MG tablet   Commonly known as: ZOCOR   Take 20 mg by mouth daily.      sitaGLIPtan-metformin 50-1000 MG per tablet   Commonly known as: JANUMET   Take 1 tablet by mouth 2 (two) times daily.            Discharge Orders    Future Orders Please Complete By Expires   Diet - low sodium heart healthy      Diet Carb Modified      Increase activity slowly      Discharge instructions      Comments:   Hold tenoretic (atenolol/chlorthalidone) for a week then resume.      Follow-up Information    Follow up with [facility], [facility] on 05/17/2011. (at 2:15pm if symptoms worsen)    Contact information:   [address] [place] [address] [phone]          Disposition: 01-Home or Self  Care  Discharged Condition: stable  Consults:  none  Labs:    Glucose-Capillary       150 110 89 131      CHEM PROFILE    Sodium       135 138 140      Potassium       3.2 3.7 3.7      Chloride       102 107 109      CO2       21 20 20       BUN       42 21  11      Creatinine, Ser       1.89  1.16 1.05      Calcium       7.9 8.4 8.2      GFR calc non Af Amer       38 68 76      GFR calc Af Denyse Dago  43  78  89       Glucose, Bld       97 113 90      Magnesium       2.1 2.1       Alkaline Phosphatase       79        Albumin       4.3        Lipase       31        AST       29        ALT       23        Total Protein       7.5        Total Bilirubin       0.8         OTHER CHEM    Lactic Acid, Venous       0.8         CBC    WBC       4.7 4.0       RBC       4.74 4.72       Hemoglobin       12.6 12.8       HCT       36.7 36.9       MCV       77.4 78.2       MCH       26.6 27.1       MCHC       34.3 34.7       RDW       16.2 16.2       Platelets       205  202        DIFFERENTIAL    Neutrophils Relative       67        Lymphocytes Relative       11        Monocytes Relative       22        Eosinophils Relative       0        Basophils Relative       0        Neutro Abs       6.2        Lymphs Abs       1.0        Monocytes Absolute       2.0        Eosinophils Absolute       0.0        Basophils Absolute       0.0        Smear Review       MORPHOLOGY UNREMARKABLE         DIABETES    Glucose, Bld       97 113 90       URINALYSIS    Color, Urine       YELLOW        APPearance       CLEAR        Specific Gravity, Urine       1.025        pH       5.5        Glucose, UA       NEGATIVE  Bilirubin Urine       NEGATIVE        Ketones, ur       NEGATIVE        Protein, ur       NEGATIVE        Urobilinogen, UA       0.2        Nitrite       NEGATIVE        Leukocytes, UA       NEGATIVE         Hgb urine dipstick       NEGATIVE          STOOL TESTS    C difficile by pcr        NEGATIVE       Ova and parasites         NO        Diagnostics:  Dg Abd Acute W/chest  05/01/2011  *RADIOLOGY REPORT*  Clinical Data: Vomiting, diarrhea.  ACUTE ABDOMEN SERIES (ABDOMEN 2 VIEW & CHEST 1 VIEW)  Comparison: None.  Findings: Hyperinflation.  No focal consolidation.  No pleural effusion or pneumothorax.  Cardiomediastinal contours within normal limits.  There is osseous deformity of the bilateral glenohumeral joints with surgical changes on the right.  Osteopenia.  Nonobstructive bowel gas pattern.  Organ outlines normal where seen.  There is no free intraperitoneal air identified.  Bilateral vas deferens calcifications.  No acute osseous abnormality.  IMPRESSION: Hyperinflation without focal consolidation.  Nonobstructive bowel gas pattern.  Vas deferens calcifications in keeping with diabetes.  Deformity of the bilateral glenohumeral joints appear chronic.  Original Report Authenticated By: Waneta Martins, M.D.   EKG: ST with [facility] Course: See H&P for complete admission details. Willie Oneal is a 61 year old white male who presented with diarrhea for several weeks. Initially he took Pepto-Bismol with some improvement. He developed vomiting several days prior to admission. He was exposed to a 81-year-old family member with similar symptoms. In the ER, he was noted to be borderline hypotensive. He had a lactic acidosis. Creatinine was increased. Prior to admission he had been on an ACE inhibitor thiazide. His white blood cell count was normal. He was admitted and given fluid. His antihypertensives were held. He ruled out for C. difficile. His symptoms had improved and by the time of discharge, he was tolerating a solid diet, feeling better, but still had occasional loose stool. Ova and parasite exam and culture are negative to date. He is to hold his ACE inhibitor hydrochlorothiazide. Drink plenty of liquids and may take Imodium as needed. Total  time on the day of discharge greater than 30 minutes.  Discharge Exam:  Blood pressure 114/65, pulse 81, temperature 98.2 F (36.8 C), temperature source Oral, resp. rate 20, height 5\' 10"  (1.778 m), weight 94.5 kg (208 lb 5.4 oz), SpO2 98.00%.  Gen.: Comfortable. Talkative. Watching around in his room. Lungs clear to auscultation bilaterally without wheeze rhonchi or rales Cardiovascular regular rate rhythm without murmurs gallops rubs Abdomen soft nontender nondistended Extremities no clubbing cyanosis or edema  Signed: Kishana Battey L 05/06/2011, 4:54 PM

## 2011-05-07 LAB — STOOL CULTURE

## 2011-05-08 NOTE — Progress Notes (Signed)
Discharge summary sent to payer through [facility]  

## 2013-12-10 DIAGNOSIS — H6091 Unspecified otitis externa, right ear: Secondary | ICD-10-CM | POA: Diagnosis not present

## 2013-12-29 DIAGNOSIS — I10 Essential (primary) hypertension: Secondary | ICD-10-CM | POA: Diagnosis not present

## 2013-12-29 DIAGNOSIS — E785 Hyperlipidemia, unspecified: Secondary | ICD-10-CM | POA: Diagnosis not present

## 2013-12-29 DIAGNOSIS — E119 Type 2 diabetes mellitus without complications: Secondary | ICD-10-CM | POA: Diagnosis not present

## 2013-12-31 DIAGNOSIS — E782 Mixed hyperlipidemia: Secondary | ICD-10-CM | POA: Diagnosis not present

## 2013-12-31 DIAGNOSIS — I10 Essential (primary) hypertension: Secondary | ICD-10-CM | POA: Diagnosis not present

## 2013-12-31 DIAGNOSIS — E1165 Type 2 diabetes mellitus with hyperglycemia: Secondary | ICD-10-CM | POA: Diagnosis not present

## 2014-01-28 DIAGNOSIS — I10 Essential (primary) hypertension: Secondary | ICD-10-CM | POA: Diagnosis not present

## 2014-01-28 DIAGNOSIS — E1165 Type 2 diabetes mellitus with hyperglycemia: Secondary | ICD-10-CM | POA: Diagnosis not present

## 2014-01-28 DIAGNOSIS — E782 Mixed hyperlipidemia: Secondary | ICD-10-CM | POA: Diagnosis not present

## 2014-06-26 DIAGNOSIS — M109 Gout, unspecified: Secondary | ICD-10-CM | POA: Diagnosis not present

## 2014-06-26 DIAGNOSIS — M25559 Pain in unspecified hip: Secondary | ICD-10-CM | POA: Diagnosis not present

## 2014-06-26 DIAGNOSIS — M25569 Pain in unspecified knee: Secondary | ICD-10-CM | POA: Diagnosis not present

## 2014-07-24 DIAGNOSIS — M109 Gout, unspecified: Secondary | ICD-10-CM | POA: Diagnosis not present

## 2014-07-24 DIAGNOSIS — I10 Essential (primary) hypertension: Secondary | ICD-10-CM | POA: Diagnosis not present

## 2014-07-24 DIAGNOSIS — E782 Mixed hyperlipidemia: Secondary | ICD-10-CM | POA: Diagnosis not present

## 2014-07-24 DIAGNOSIS — E1165 Type 2 diabetes mellitus with hyperglycemia: Secondary | ICD-10-CM | POA: Diagnosis not present

## 2014-07-29 DIAGNOSIS — E119 Type 2 diabetes mellitus without complications: Secondary | ICD-10-CM | POA: Diagnosis not present

## 2014-07-29 DIAGNOSIS — E785 Hyperlipidemia, unspecified: Secondary | ICD-10-CM | POA: Diagnosis not present

## 2014-07-29 DIAGNOSIS — I10 Essential (primary) hypertension: Secondary | ICD-10-CM | POA: Diagnosis not present

## 2014-07-29 DIAGNOSIS — E1165 Type 2 diabetes mellitus with hyperglycemia: Secondary | ICD-10-CM | POA: Diagnosis not present

## 2014-07-29 DIAGNOSIS — E782 Mixed hyperlipidemia: Secondary | ICD-10-CM | POA: Diagnosis not present

## 2014-07-29 DIAGNOSIS — E790 Hyperuricemia without signs of inflammatory arthritis and tophaceous disease: Secondary | ICD-10-CM | POA: Diagnosis not present

## 2014-08-24 DIAGNOSIS — M25561 Pain in right knee: Secondary | ICD-10-CM | POA: Diagnosis not present

## 2014-08-25 ENCOUNTER — Ambulatory Visit (HOSPITAL_COMMUNITY)
Admission: RE | Admit: 2014-08-25 | Discharge: 2014-08-25 | Disposition: A | Discharge status: Home / Self Care | Payer: Medicare Other | Source: Ambulatory Visit | Attending: Internal Medicine | Admitting: Internal Medicine

## 2014-08-25 ENCOUNTER — Other Ambulatory Visit (HOSPITAL_COMMUNITY): Payer: Self-pay | Admitting: Internal Medicine

## 2014-08-25 DIAGNOSIS — M25561 Pain in right knee: Secondary | ICD-10-CM | POA: Diagnosis present

## 2014-08-25 DIAGNOSIS — M11261 Other chondrocalcinosis, right knee: Secondary | ICD-10-CM | POA: Diagnosis not present

## 2014-08-25 DIAGNOSIS — M1120 Other chondrocalcinosis, unspecified site: Secondary | ICD-10-CM | POA: Diagnosis not present

## 2014-08-25 DIAGNOSIS — M1711 Unilateral primary osteoarthritis, right knee: Secondary | ICD-10-CM | POA: Diagnosis not present

## 2014-09-24 ENCOUNTER — Ambulatory Visit (INDEPENDENT_AMBULATORY_CARE_PROVIDER_SITE_OTHER): Payer: 59 | Admitting: Orthopedic Surgery

## 2014-09-24 VITALS — BP 135/80 | Ht 70.0 in | Wt 213.0 lb

## 2014-09-24 DIAGNOSIS — S83241A Other tear of medial meniscus, current injury, right knee, initial encounter: Secondary | ICD-10-CM | POA: Diagnosis not present

## 2014-09-24 MED ORDER — DICLOFENAC POTASSIUM 50 MG PO TABS: 50.0000 mg | ORAL_TABLET | Freq: Two times a day (BID) | ORAL | Status: DC

## 2014-09-24 NOTE — Progress Notes (Signed)
Patient ID: Willie Oneal, male   DOB: 10/10/1950, 64 y.o.   MRN: [id number]  Patient ID: Willie Oneal, male   DOB: 03/16/1950, 64 y.o.   MRN: [id number]  New patient   Chief Complaint  Patient presents with  . Knee Pain    right knee pain and swelling, no known injury, REFERRED BY Z HALL     Willie Oneal is a 65 y.o. male.   HPI This 64 year old male comes planes of 3 months history of pain swelling stiffness and aching in his right knee which is constant and he rates his pain a 10.? X-rays were a sickly negative for any significant problems and he had no trauma. He thought he might of had a gout attack he couldn't afford colchicine was placed on allopurinol doesn't seem to have had an effect. Has not had therapy or anti-inflammatory. Review of systems negative except for swollen stiff joints as stated gait problem as stated in joint pain.  History as noted below  Review of Systems See hpi  Past Medical History  Diagnosis Date  . Diabetes mellitus   . HTN (hypertension)   . Hyperlipidemia     Past Surgical History  Procedure Laterality Date  . Shoulder surgery    . Tonsillectomy      No family history on file.  Social History Social History  Substance Use Topics  . Smoking status: Never Smoker   . Smokeless tobacco: Not on file  . Alcohol Use: No    No Known Allergies  Current Outpatient Prescriptions  Medication Sig Dispense Refill  . ALLOPURINOL PO Take by mouth.    . ATENOLOL-CHLORTHALIDONE PO Take by mouth.    Marland Kitchen lisinopril (PRINIVIL,ZESTRIL) 10 MG tablet Take 10 mg by mouth daily.    Marland Kitchen omeprazole (PRILOSEC) 20 MG capsule Take 20 mg by mouth daily.      . simvastatin (ZOCOR) 20 MG tablet Take 20 mg by mouth daily.    . sitaGLIPtan-metformin (JANUMET) 50-1000 MG per tablet Take 1 tablet by mouth 2 (two) times daily.    . diclofenac (CATAFLAM) 50 MG tablet Take 1 tablet (50 mg total) by mouth 2 (two) times daily. 60 tablet 2   No current  facility-administered medications for this visit.       Physical Exam Blood pressure 135/80, height 5\' 10"  (1.778 m), weight 213 lb (96.616 kg). Physical Exam The patient is well developed well nourished and well groomed. Orientation to person place and time is normal  Mood is pleasant. Ambulatory status is normal without a limp Skin remains intact without laceration ulceration or erythema Gross motor exam is intact without atrophy. Muscle tone normal grade 5 motor strength Neurovascular exam remains intact Inspection right knee medial tenderness lateral tenderness small effusion. Joint range of motion range of motion deficit compared to the left knee 15 of flexion Joint stability all ligaments were stable  Left knee normal    Data Reviewed  Imaging I basically C mild degenerative changes  Assessment   Encounter Diagnosis  Name Primary?  . Medial meniscus tear, right, initial encounter Yes    Plan   Recommend MRI of the knee basically because he can't straighten the knee without pain and he says he feels like the knee is coming give out on

## 2014-09-24 NOTE — Patient Instructions (Signed)
We will schedule MRI for you and call you with results  Medication sent to your pharmacy

## 2014-10-15 ENCOUNTER — Ambulatory Visit (HOSPITAL_COMMUNITY)
Admission: RE | Admit: 2014-10-15 | Discharge: 2014-10-15 | Disposition: A | Discharge status: Home / Self Care | Payer: Medicare Other | Source: Ambulatory Visit | Attending: Orthopedic Surgery | Admitting: Orthopedic Surgery

## 2014-10-15 ENCOUNTER — Telehealth: Payer: Self-pay | Admitting: Orthopedic Surgery

## 2014-10-15 DIAGNOSIS — X58XXXA Exposure to other specified factors, initial encounter: Secondary | ICD-10-CM | POA: Diagnosis not present

## 2014-10-15 DIAGNOSIS — M25561 Pain in right knee: Secondary | ICD-10-CM | POA: Diagnosis not present

## 2014-10-15 DIAGNOSIS — S83241A Other tear of medial meniscus, current injury, right knee, initial encounter: Secondary | ICD-10-CM | POA: Diagnosis not present

## 2014-10-15 NOTE — Telephone Encounter (Signed)
WANTS TO TAKE MEDS   KNEE BETTER  WILL CALL IF GETS WORSE

## 2014-11-17 DIAGNOSIS — E1165 Type 2 diabetes mellitus with hyperglycemia: Secondary | ICD-10-CM | POA: Diagnosis not present

## 2014-11-21 DIAGNOSIS — I10 Essential (primary) hypertension: Secondary | ICD-10-CM | POA: Diagnosis not present

## 2014-11-21 DIAGNOSIS — E782 Mixed hyperlipidemia: Secondary | ICD-10-CM | POA: Diagnosis not present

## 2014-11-21 DIAGNOSIS — E119 Type 2 diabetes mellitus without complications: Secondary | ICD-10-CM | POA: Diagnosis not present

## 2015-01-01 ENCOUNTER — Other Ambulatory Visit: Payer: Self-pay | Admitting: *Deleted

## 2015-01-01 MED ORDER — DICLOFENAC POTASSIUM 50 MG PO TABS: 50.0000 mg | ORAL_TABLET | Freq: Two times a day (BID) | ORAL | Status: DC

## 2015-02-08 ENCOUNTER — Other Ambulatory Visit: Payer: Self-pay | Admitting: *Deleted

## 2015-02-08 MED ORDER — DICLOFENAC SODIUM 75 MG PO TBEC: 75.0000 mg | DELAYED_RELEASE_TABLET | Freq: Two times a day (BID) | ORAL | Status: DC

## 2015-02-09 ENCOUNTER — Telehealth: Payer: Self-pay | Admitting: *Deleted

## 2015-02-09 NOTE — Telephone Encounter (Signed)
UPDATE: Patient called back regarding refill request for medication Diclofenac; states "DISREGARD" this request for the medication refill with [facility].  Patient states he went to his local pharmacy, [facility], [place], and they already have his refill ready there; he has just picked it up (said it is Diclofenac 75mg , extended release).  I relayed to patient that this may have already been in their system for 90-day supply; therefore, before contacting our office to request refill, to first call his pharmacy, and ask them to send [place] a request, unless he is requesting a narcotic.

## 2015-02-09 NOTE — Telephone Encounter (Signed)
Patient came into the office Requesting his medication diclofenac 50mg   Twice a day to be changed to 100 mg extended relief. Patient would like this sent to [facility]. Please advise [phone]

## 2015-02-09 NOTE — Telephone Encounter (Signed)
Routing to Dr Harrison for approval 

## 2015-02-10 NOTE — Telephone Encounter (Signed)
Noted  

## 2015-02-23 DIAGNOSIS — E119 Type 2 diabetes mellitus without complications: Secondary | ICD-10-CM | POA: Diagnosis not present

## 2015-02-23 DIAGNOSIS — I10 Essential (primary) hypertension: Secondary | ICD-10-CM | POA: Diagnosis not present

## 2015-02-23 DIAGNOSIS — Z125 Encounter for screening for malignant neoplasm of prostate: Secondary | ICD-10-CM | POA: Diagnosis not present

## 2015-02-23 DIAGNOSIS — E785 Hyperlipidemia, unspecified: Secondary | ICD-10-CM | POA: Diagnosis not present

## 2015-02-23 DIAGNOSIS — K210 Gastro-esophageal reflux disease with esophagitis: Secondary | ICD-10-CM | POA: Diagnosis not present

## 2015-02-23 DIAGNOSIS — E782 Mixed hyperlipidemia: Secondary | ICD-10-CM | POA: Diagnosis not present

## 2015-02-23 DIAGNOSIS — K219 Gastro-esophageal reflux disease without esophagitis: Secondary | ICD-10-CM | POA: Diagnosis not present

## 2015-02-26 ENCOUNTER — Encounter (INDEPENDENT_AMBULATORY_CARE_PROVIDER_SITE_OTHER): Payer: Self-pay | Admitting: *Deleted

## 2015-03-09 DIAGNOSIS — I10 Essential (primary) hypertension: Secondary | ICD-10-CM | POA: Diagnosis not present

## 2015-03-09 DIAGNOSIS — E119 Type 2 diabetes mellitus without complications: Secondary | ICD-10-CM | POA: Diagnosis not present

## 2015-03-09 DIAGNOSIS — K219 Gastro-esophageal reflux disease without esophagitis: Secondary | ICD-10-CM | POA: Diagnosis not present

## 2015-03-09 DIAGNOSIS — E782 Mixed hyperlipidemia: Secondary | ICD-10-CM | POA: Diagnosis not present

## 2015-03-17 ENCOUNTER — Other Ambulatory Visit: Payer: Self-pay | Admitting: *Deleted

## 2015-03-17 MED ORDER — DICLOFENAC SODIUM 75 MG PO TBEC: 75.0000 mg | DELAYED_RELEASE_TABLET | Freq: Two times a day (BID) | ORAL | Status: DC

## 2015-04-09 ENCOUNTER — Encounter (INDEPENDENT_AMBULATORY_CARE_PROVIDER_SITE_OTHER): Payer: Self-pay | Admitting: *Deleted

## 2015-04-12 ENCOUNTER — Other Ambulatory Visit (INDEPENDENT_AMBULATORY_CARE_PROVIDER_SITE_OTHER): Payer: Self-pay | Admitting: *Deleted

## 2015-04-12 DIAGNOSIS — Z1211 Encounter for screening for malignant neoplasm of colon: Secondary | ICD-10-CM

## 2015-04-19 ENCOUNTER — Other Ambulatory Visit: Payer: Self-pay | Admitting: *Deleted

## 2015-04-19 MED ORDER — DICLOFENAC SODIUM 75 MG PO TBEC: 75.0000 mg | DELAYED_RELEASE_TABLET | Freq: Two times a day (BID) | ORAL | Status: DC

## 2015-04-22 ENCOUNTER — Other Ambulatory Visit: Payer: Self-pay | Admitting: *Deleted

## 2015-04-22 MED ORDER — DICLOFENAC SODIUM 75 MG PO TBEC: 75.0000 mg | DELAYED_RELEASE_TABLET | Freq: Two times a day (BID) | ORAL | Status: DC

## 2015-05-26 ENCOUNTER — Other Ambulatory Visit: Payer: Self-pay | Admitting: *Deleted

## 2015-05-26 MED ORDER — DICLOFENAC SODIUM 75 MG PO TBEC: 75.0000 mg | DELAYED_RELEASE_TABLET | Freq: Two times a day (BID) | ORAL | Status: DC

## 2015-06-11 ENCOUNTER — Other Ambulatory Visit (INDEPENDENT_AMBULATORY_CARE_PROVIDER_SITE_OTHER): Payer: Self-pay | Admitting: *Deleted

## 2015-06-11 ENCOUNTER — Encounter (INDEPENDENT_AMBULATORY_CARE_PROVIDER_SITE_OTHER): Payer: Self-pay | Admitting: *Deleted

## 2015-06-11 NOTE — Telephone Encounter (Signed)
Patient needs trilyte 

## 2015-06-14 MED ORDER — PEG 3350-KCL-NA BICARB-NACL 420 G PO SOLR: 4000.0000 mL | Freq: Once | ORAL | Status: DC

## 2015-06-23 ENCOUNTER — Telehealth (INDEPENDENT_AMBULATORY_CARE_PROVIDER_SITE_OTHER): Payer: Self-pay | Admitting: *Deleted

## 2015-06-23 NOTE — Telephone Encounter (Signed)
Referring MD/PCP: hall   Procedure: tcs  Reason/Indication:  screening  Has patient had this procedure before?  Yes, over 10 yrs ago  If so, when, by whom and where?    Is there a family history of colon cancer?  no  Who?  What age when diagnosed?    Is patient diabetic?   yes      Does patient have prosthetic heart valve or mechanical valve?  no  Do you have a pacemaker?  no  Has patient ever had endocarditis? no  Has patient had joint replacement within last 12 months?  no  Does patient tend to be constipated or take laxatives? no  Does patient have a history of alcohol/drug use?  no  Is patient on Coumadin, Plavix and/or Aspirin? no  Medications: see epid  Allergies: nkda  Medication Adjustment: hold DM meds evening before  Procedure date & time: 07/21/15 at 730

## 2015-06-30 NOTE — Telephone Encounter (Signed)
agree

## 2015-07-14 DIAGNOSIS — E782 Mixed hyperlipidemia: Secondary | ICD-10-CM | POA: Diagnosis not present

## 2015-07-14 DIAGNOSIS — E119 Type 2 diabetes mellitus without complications: Secondary | ICD-10-CM | POA: Diagnosis not present

## 2015-07-16 DIAGNOSIS — E782 Mixed hyperlipidemia: Secondary | ICD-10-CM | POA: Diagnosis not present

## 2015-07-16 DIAGNOSIS — K219 Gastro-esophageal reflux disease without esophagitis: Secondary | ICD-10-CM | POA: Diagnosis not present

## 2015-07-16 DIAGNOSIS — I10 Essential (primary) hypertension: Secondary | ICD-10-CM | POA: Diagnosis not present

## 2015-07-16 DIAGNOSIS — E119 Type 2 diabetes mellitus without complications: Secondary | ICD-10-CM | POA: Diagnosis not present

## 2015-07-21 ENCOUNTER — Encounter (HOSPITAL_COMMUNITY)
Admission: RE | Disposition: A | Discharge status: Home / Self Care | Payer: Self-pay | Source: Ambulatory Visit | Attending: Internal Medicine

## 2015-07-21 ENCOUNTER — Ambulatory Visit (HOSPITAL_COMMUNITY)
Admission: RE | Admit: 2015-07-21 | Discharge: 2015-07-21 | Disposition: A | Discharge status: Home / Self Care | Payer: Medicare Other | Source: Ambulatory Visit | Attending: Internal Medicine | Admitting: Internal Medicine

## 2015-07-21 ENCOUNTER — Encounter (HOSPITAL_COMMUNITY): Payer: Self-pay | Admitting: *Deleted

## 2015-07-21 DIAGNOSIS — D120 Benign neoplasm of cecum: Secondary | ICD-10-CM | POA: Diagnosis not present

## 2015-07-21 DIAGNOSIS — E785 Hyperlipidemia, unspecified: Secondary | ICD-10-CM | POA: Diagnosis not present

## 2015-07-21 DIAGNOSIS — K219 Gastro-esophageal reflux disease without esophagitis: Secondary | ICD-10-CM | POA: Insufficient documentation

## 2015-07-21 DIAGNOSIS — K644 Residual hemorrhoidal skin tags: Secondary | ICD-10-CM | POA: Diagnosis not present

## 2015-07-21 DIAGNOSIS — E119 Type 2 diabetes mellitus without complications: Secondary | ICD-10-CM | POA: Diagnosis not present

## 2015-07-21 DIAGNOSIS — Z79899 Other long term (current) drug therapy: Secondary | ICD-10-CM | POA: Insufficient documentation

## 2015-07-21 DIAGNOSIS — Z87891 Personal history of nicotine dependence: Secondary | ICD-10-CM | POA: Insufficient documentation

## 2015-07-21 DIAGNOSIS — I10 Essential (primary) hypertension: Secondary | ICD-10-CM | POA: Insufficient documentation

## 2015-07-21 DIAGNOSIS — K5520 Angiodysplasia of colon without hemorrhage: Secondary | ICD-10-CM | POA: Insufficient documentation

## 2015-07-21 DIAGNOSIS — K648 Other hemorrhoids: Secondary | ICD-10-CM | POA: Diagnosis not present

## 2015-07-21 DIAGNOSIS — K6389 Other specified diseases of intestine: Secondary | ICD-10-CM | POA: Diagnosis not present

## 2015-07-21 DIAGNOSIS — Z1211 Encounter for screening for malignant neoplasm of colon: Secondary | ICD-10-CM | POA: Diagnosis not present

## 2015-07-21 HISTORY — DX: Gastro-esophageal reflux disease without esophagitis: K21.9

## 2015-07-21 HISTORY — PX: COLONOSCOPY: SHX5424

## 2015-07-21 LAB — GLUCOSE, CAPILLARY: Glucose-Capillary: 96 mg/dL (ref 65–99)

## 2015-07-21 SURGERY — COLONOSCOPY
Anesthesia: Moderate Sedation

## 2015-07-21 MED ORDER — MEPERIDINE HCL 50 MG/ML IJ SOLN
INTRAMUSCULAR | Status: AC
  Filled 2015-07-21: qty 1

## 2015-07-21 MED ORDER — MEPERIDINE HCL 50 MG/ML IJ SOLN
INTRAMUSCULAR | Status: DC | PRN
  Administered 2015-07-21 (×2): 25 mg via INTRAVENOUS

## 2015-07-21 MED ORDER — MIDAZOLAM HCL 5 MG/5ML IJ SOLN
INTRAMUSCULAR | Status: AC
  Filled 2015-07-21: qty 5

## 2015-07-21 MED ORDER — SIMETHICONE 40 MG/0.6ML PO SUSP
ORAL | Status: AC
  Filled 2015-07-21: qty 30

## 2015-07-21 MED ORDER — SODIUM CHLORIDE 0.9 % IV SOLN
INTRAVENOUS | Status: DC
  Administered 2015-07-21: 1000 mL via INTRAVENOUS

## 2015-07-21 MED ORDER — STERILE WATER FOR IRRIGATION IR SOLN
Status: DC | PRN
  Administered 2015-07-21: 08:00:00

## 2015-07-21 MED ORDER — MIDAZOLAM HCL 5 MG/5ML IJ SOLN
INTRAMUSCULAR | Status: DC | PRN
  Administered 2015-07-21: 2 mg via INTRAVENOUS
  Administered 2015-07-21: 1 mg via INTRAVENOUS
  Administered 2015-07-21 (×2): 2 mg via INTRAVENOUS

## 2015-07-21 MED ORDER — MIDAZOLAM HCL 5 MG/5ML IJ SOLN
INTRAMUSCULAR | Status: AC
  Filled 2015-07-21: qty 10

## 2015-07-21 NOTE — H&P (Signed)
Willie Oneal is an 65 y.o. male.   Chief Complaint: Patient is here for colonoscopy. HPI: Patient is 65 year old Caucasian male who is here for screening colonoscopy. He denies abdominal pain change in bowel habits or rectal bleeding. Last colonoscopy was 13 years ago. Family history is negative for CRC.  Past Medical History  Diagnosis Date  . Diabetes mellitus   . HTN (hypertension)   . Hyperlipidemia   . GERD (gastroesophageal reflux disease)     Past Surgical History  Procedure Laterality Date  . Shoulder surgery    . Tonsillectomy      History reviewed. No pertinent family history. Social History:  reports that he has quit smoking. He has never used smokeless tobacco. He reports that he does not drink alcohol or use illicit drugs.  Allergies: No Known Allergies  Medications Prior to Admission  Medication Sig Dispense Refill  . allopurinol (ZYLOPRIM) 100 MG tablet Take 100 mg by mouth daily.    Marland Kitchen atenolol-chlorthalidone (TENORETIC) 50-25 MG tablet Take 1 tablet by mouth daily.    . diclofenac (VOLTAREN) 75 MG EC tablet Take 1 tablet (75 mg total) by mouth 2 (two) times daily with a meal. 60 tablet 5  . glipiZIDE (GLUCOTROL XL) 10 MG 24 hr tablet Take 10 mg by mouth daily with breakfast.    . Linagliptin-Metformin HCl (JENTADUETO) 2.05-998 MG TABS Take 1 tablet by mouth daily.    Marland Kitchen lisinopril (PRINIVIL,ZESTRIL) 10 MG tablet Take 10 mg by mouth daily.    Marland Kitchen omeprazole (PRILOSEC) 20 MG capsule Take 20 mg by mouth daily.      . polyethylene glycol-electrolytes (TRILYTE) 420 g solution Take 4,000 mLs by mouth once. 4000 mL 0  . simvastatin (ZOCOR) 20 MG tablet Take 20 mg by mouth daily.      Results for orders placed or performed during the hospital encounter of 07/21/15 (from the past 48 hour(s))  Glucose, capillary     Status: None   Collection Time: 07/21/15  7:05 AM  Result Value Ref Range   Glucose-Capillary 96 65 - 99 mg/dL   No results found.  ROS  Blood pressure  122/83, pulse 78, temperature 97.7 F (36.5 C), temperature source Oral, resp. rate 14, height 5' 10.5" (1.791 m), weight 215 lb (97.523 kg), SpO2 98 %. Physical Exam  Constitutional: He appears well-developed and well-nourished.  HENT:  Mouth/Throat: Oropharynx is clear and moist.  Eyes: Conjunctivae are normal. No scleral icterus.  Neck: No thyromegaly present.  Cardiovascular: Normal rate, regular rhythm and normal heart sounds.   No murmur heard. Respiratory: Effort normal and breath sounds normal.  GI: Soft. He exhibits no distension. There is no tenderness.  Musculoskeletal: He exhibits no edema.  Lymphadenopathy:    He has no cervical adenopathy.  Neurological: He is alert.  Skin: Skin is warm and dry.     Assessment/Plan Average risk screening colonoscopy.  Hildred Laser, MD 07/21/2015, 7:31 AM

## 2015-07-21 NOTE — Discharge Instructions (Signed)
Resume usual medications and diet. No driving for 24 hours. Physician will call with biopsy results.  Colonoscopy, Care After Refer to this sheet in the next few weeks. These instructions provide you with information on caring for yourself after your procedure. Your health care provider may also give you more specific instructions. Your treatment has been planned according to current medical practices, but problems sometimes occur. Call your health care provider if you have any problems or questions after your procedure. WHAT TO EXPECT AFTER THE PROCEDURE  After your procedure, it is typical to have the following:  A small amount of blood in your stool.  Moderate amounts of gas and mild abdominal cramping or bloating. HOME CARE INSTRUCTIONS  Do not drive, operate machinery, or sign important documents for 24 hours.  You may shower and resume your regular physical activities, but move at a slower pace for the first 24 hours.  Take frequent rest periods for the first 24 hours.  Walk around or put a warm pack on your abdomen to help reduce abdominal cramping and bloating.  Drink enough fluids to keep your urine clear or pale yellow.  You may resume your normal diet as instructed by your health care provider. Avoid heavy or fried foods that are hard to digest.  Avoid drinking alcohol for 24 hours or as instructed by your health care provider.  Only take over-the-counter or prescription medicines as directed by your health care provider.  If a tissue sample (biopsy) was taken during your procedure:  Do not take aspirin or blood thinners for 7 days, or as instructed by your health care provider.  Do not drink alcohol for 7 days, or as instructed by your health care provider.  Eat soft foods for the first 24 hours. SEEK MEDICAL CARE IF: You have persistent spotting of blood in your stool 2-3 days after the procedure. SEEK IMMEDIATE MEDICAL CARE IF:  You have more than a small  spotting of blood in your stool.  You pass large blood clots in your stool.  Your abdomen is swollen (distended).  You have nausea or vomiting.  You have a fever.  You have increasing abdominal pain that is not relieved with medicine.   This information is not intended to replace advice given to you by your health care provider. Make sure you discuss any questions you have with your health care provider.   Document Released: 08/03/2003 Document Revised: 10/09/2012 Document Reviewed: 08/26/2012 Elsevier Interactive Patient Education 2016 Elsevier Inc.  Colon Polyps Polyps are lumps of extra tissue growing inside the body. Polyps can grow in the large intestine (colon). Most colon polyps are noncancerous (benign). However, some colon polyps can become cancerous over time. Polyps that are larger than a pea may be harmful. To be safe, caregivers remove and test all polyps. CAUSES  Polyps form when mutations in the genes cause your cells to grow and divide even though no more tissue is needed. RISK FACTORS There are a number of risk factors that can increase your chances of getting colon polyps. They include:  Being older than 50 years.  Family history of colon polyps or colon cancer.  Long-term colon diseases, such as colitis or Crohn disease.  Being overweight.  Smoking.  Being inactive.  Drinking too much alcohol. SYMPTOMS  Most small polyps do not cause symptoms. If symptoms are present, they may include:  Blood in the stool. The stool may look dark red or black.  Constipation or diarrhea that lasts longer  than 1 week. DIAGNOSIS People often do not know they have polyps until their caregiver finds them during a regular checkup. Your caregiver can use 4 tests to check for polyps:  Digital rectal exam. The caregiver wears gloves and feels inside the rectum. This test would find polyps only in the rectum.  Barium enema. The caregiver puts a liquid called barium into your  rectum before taking X-rays of your colon. Barium makes your colon look white. Polyps are dark, so they are easy to see in the X-ray pictures.  Sigmoidoscopy. A thin, flexible tube (sigmoidoscope) is placed into your rectum. The sigmoidoscope has a light and tiny camera in it. The caregiver uses the sigmoidoscope to look at the last third of your colon.  Colonoscopy. This test is like sigmoidoscopy, but the caregiver looks at the entire colon. This is the most common method for finding and removing polyps. TREATMENT  Any polyps will be removed during a sigmoidoscopy or colonoscopy. The polyps are then tested for cancer. PREVENTION  To help lower your risk of getting more colon polyps:  Eat plenty of fruits and vegetables. Avoid eating fatty foods.  Do not smoke.  Avoid drinking alcohol.  Exercise every day.  Lose weight if recommended by your caregiver.  Eat plenty of calcium and folate. Foods that are rich in calcium include milk, cheese, and broccoli. Foods that are rich in folate include chickpeas, kidney beans, and spinach. HOME CARE INSTRUCTIONS Keep all follow-up appointments as directed by your caregiver. You may need periodic exams to check for polyps. SEEK MEDICAL CARE IF: You notice bleeding during a bowel movement.   This information is not intended to replace advice given to you by your health care provider. Make sure you discuss any questions you have with your health care provider.   Document Released: 09/15/2003 Document Revised: 01/09/2014 Document Reviewed: 02/28/2011 Elsevier Interactive Patient Education [employer].

## 2015-07-21 NOTE — Op Note (Signed)
[facility] Patient Name: Willie Oneal Procedure Date: 07/21/2015 7:14 AM MRN: [id number] Date of Birth: 1950-03-07 Attending MD: Hildred Laser , MD CSN: [id number] Age: 65 Admit Type: Outpatient Procedure:                Colonoscopy Indications:              Screening for colorectal malignant neoplasm Providers:                Hildred Laser, MD, Lurline Del, RN, Randa Spike,                            Technician Referring MD:             Delphina Cahill, MD Medicines:                Meperidine 50 mg IV Complications:            No immediate complications. Estimated Blood Loss:     Estimated blood loss was minimal. Procedure:                Pre-Anesthesia Assessment:                           - Prior to the procedure, a History and Physical                            was performed, and patient medications and                            allergies were reviewed. The patient's tolerance of                            previous anesthesia was also reviewed. The risks                            and benefits of the procedure and the sedation                            options and risks were discussed with the patient.                            All questions were answered, and informed consent                            was obtained. Prior Anticoagulants: The patient                            last took previous NSAID medication 1 day prior to                            the procedure. ASA Grade Assessment: II - A patient                            with mild systemic disease. After reviewing the  risks and benefits, the patient was deemed in                            satisfactory condition to undergo the procedure.                           After obtaining informed consent, the colonoscope                            was passed under direct vision. Throughout the                            procedure, the patient's blood pressure, pulse, and    oxygen saturations were monitored continuously. The                            [id number]) scope was introduced through                            the anus and advanced to the the cecum, identified                            by appendiceal orifice and ileocecal valve. The                            colonoscopy was performed without difficulty. The                            patient tolerated the procedure well. The quality                            of the bowel preparation was good. Anatomical                            landmarks were photographed. Scope In: 7:41:24 AM Scope Out: 8:00:14 AM Scope Withdrawal Time: 0 hours 12 minutes 36 seconds  Total Procedure Duration: 0 hours 18 minutes 50 seconds  Findings:      A single small angiodysplastic lesion without bleeding was found in the       cecum.      Three sessile polyps were found in the cecum. The polyps were diminutive       in size. These polyps were removed with a cold biopsy forceps. Resection       and retrieval were complete.      External hemorrhoids were found during retroflexion. The hemorrhoids       were small. Impression:               - A single non-bleeding colonic angiodysplastic                            lesion.                           - Three diminutive polyps in the cecum, removed  with a cold biopsy forceps. Resected and retrieved.                           - External hemorrhoids. Moderate Sedation:      Moderate (conscious) sedation was administered by the endoscopy nurse       and supervised by the endoscopist. The following parameters were       monitored: oxygen saturation, heart rate, blood pressure, CO2       capnography and response to care. Total physician intraservice time was       25 minutes. Recommendation:           - Patient has a contact number available for                            emergencies. The signs and symptoms of potential                             delayed complications were discussed with the                            patient. Return to normal activities tomorrow.                            Written discharge instructions were provided to the                            patient.                           - Resume previous diet today.                           - Continue present medications.                           - Repeat colonoscopy for surveillance based on                            pathology results. Procedure Code(s):        --- Professional ---                           [phone], Colonoscopy, flexible; with biopsy, single                            or multiple                           99152, Moderate sedation services provided by the                            same physician or other qualified health care                            professional performing the diagnostic or  therapeutic service that the sedation supports,                            requiring the presence of an independent trained                            observer to assist in the monitoring of the                            patient's level of consciousness and physiological                            status; initial 15 minutes of intraservice time,                            patient age 109 years or older                           [phone], Moderate sedation services; each additional                            15 minutes intraservice time Diagnosis Code(s):        --- Professional ---                           Z12.11, Encounter for screening for malignant                            neoplasm of colon                           K55.20, Angiodysplasia of colon without hemorrhage                           D12.0, Benign neoplasm of cecum                           K64.4, Residual hemorrhoidal skin tags CPT copyright 2016 American Medical Association. All rights reserved. The codes documented in this report are preliminary and upon coder  review may  be revised to meet current compliance requirements. Hildred Laser, MD Hildred Laser, MD 07/21/2015 8:14:02 AM This report has been signed electronically. Number of Addenda: 0

## 2015-07-23 ENCOUNTER — Encounter (HOSPITAL_COMMUNITY): Payer: Self-pay | Admitting: Internal Medicine

## 2015-07-23 DIAGNOSIS — H5213 Myopia, bilateral: Secondary | ICD-10-CM | POA: Diagnosis not present

## 2015-07-23 DIAGNOSIS — H52223 Regular astigmatism, bilateral: Secondary | ICD-10-CM | POA: Diagnosis not present

## 2015-07-23 DIAGNOSIS — H524 Presbyopia: Secondary | ICD-10-CM | POA: Diagnosis not present

## 2015-07-23 DIAGNOSIS — E119 Type 2 diabetes mellitus without complications: Secondary | ICD-10-CM | POA: Diagnosis not present

## 2015-11-10 DIAGNOSIS — D1801 Hemangioma of skin and subcutaneous tissue: Secondary | ICD-10-CM | POA: Diagnosis not present

## 2015-11-10 DIAGNOSIS — D485 Neoplasm of uncertain behavior of skin: Secondary | ICD-10-CM | POA: Diagnosis not present

## 2015-12-24 DIAGNOSIS — R200 Anesthesia of skin: Secondary | ICD-10-CM | POA: Diagnosis not present

## 2015-12-24 DIAGNOSIS — G5603 Carpal tunnel syndrome, bilateral upper limbs: Secondary | ICD-10-CM | POA: Diagnosis not present

## 2015-12-24 DIAGNOSIS — M79641 Pain in right hand: Secondary | ICD-10-CM | POA: Diagnosis not present

## 2016-01-08 DIAGNOSIS — R200 Anesthesia of skin: Secondary | ICD-10-CM | POA: Diagnosis not present

## 2016-01-08 DIAGNOSIS — M79641 Pain in right hand: Secondary | ICD-10-CM | POA: Diagnosis not present

## 2016-01-08 DIAGNOSIS — G5603 Carpal tunnel syndrome, bilateral upper limbs: Secondary | ICD-10-CM | POA: Diagnosis not present

## 2016-01-14 DIAGNOSIS — I10 Essential (primary) hypertension: Secondary | ICD-10-CM | POA: Diagnosis not present

## 2016-01-14 DIAGNOSIS — E119 Type 2 diabetes mellitus without complications: Secondary | ICD-10-CM | POA: Diagnosis not present

## 2016-01-21 DIAGNOSIS — R944 Abnormal results of kidney function studies: Secondary | ICD-10-CM | POA: Diagnosis not present

## 2016-01-21 DIAGNOSIS — I10 Essential (primary) hypertension: Secondary | ICD-10-CM | POA: Diagnosis not present

## 2016-01-21 DIAGNOSIS — M25561 Pain in right knee: Secondary | ICD-10-CM | POA: Diagnosis not present

## 2016-01-21 DIAGNOSIS — E119 Type 2 diabetes mellitus without complications: Secondary | ICD-10-CM | POA: Diagnosis not present

## 2016-01-21 DIAGNOSIS — E782 Mixed hyperlipidemia: Secondary | ICD-10-CM | POA: Diagnosis not present

## 2016-01-21 DIAGNOSIS — K219 Gastro-esophageal reflux disease without esophagitis: Secondary | ICD-10-CM | POA: Diagnosis not present

## 2016-02-15 DIAGNOSIS — G5601 Carpal tunnel syndrome, right upper limb: Secondary | ICD-10-CM | POA: Diagnosis not present

## 2016-02-18 DIAGNOSIS — M25561 Pain in right knee: Secondary | ICD-10-CM | POA: Diagnosis not present

## 2016-02-18 DIAGNOSIS — Z6828 Body mass index (BMI) 28.0-28.9, adult: Secondary | ICD-10-CM | POA: Diagnosis not present

## 2016-02-18 DIAGNOSIS — R944 Abnormal results of kidney function studies: Secondary | ICD-10-CM | POA: Diagnosis not present

## 2016-02-18 DIAGNOSIS — I10 Essential (primary) hypertension: Secondary | ICD-10-CM | POA: Diagnosis not present

## 2016-02-23 DIAGNOSIS — G5603 Carpal tunnel syndrome, bilateral upper limbs: Secondary | ICD-10-CM | POA: Diagnosis not present

## 2016-02-29 DIAGNOSIS — M79641 Pain in right hand: Secondary | ICD-10-CM | POA: Diagnosis not present

## 2016-03-15 DIAGNOSIS — Z4789 Encounter for other orthopedic aftercare: Secondary | ICD-10-CM | POA: Diagnosis not present

## 2016-03-15 DIAGNOSIS — R200 Anesthesia of skin: Secondary | ICD-10-CM | POA: Diagnosis not present

## 2016-03-15 DIAGNOSIS — M79641 Pain in right hand: Secondary | ICD-10-CM | POA: Diagnosis not present

## 2016-03-15 DIAGNOSIS — G5603 Carpal tunnel syndrome, bilateral upper limbs: Secondary | ICD-10-CM | POA: Diagnosis not present

## 2016-03-21 DIAGNOSIS — Z6829 Body mass index (BMI) 29.0-29.9, adult: Secondary | ICD-10-CM | POA: Diagnosis not present

## 2016-03-21 DIAGNOSIS — R944 Abnormal results of kidney function studies: Secondary | ICD-10-CM | POA: Diagnosis not present

## 2016-03-21 DIAGNOSIS — I10 Essential (primary) hypertension: Secondary | ICD-10-CM | POA: Diagnosis not present

## 2016-03-21 DIAGNOSIS — M25561 Pain in right knee: Secondary | ICD-10-CM | POA: Diagnosis not present

## 2016-04-28 IMAGING — MR MR KNEE*R* W/O CM
4 of 8 series · 13 of 40 positions shown · non-contrast
Comparison: Plain films of the right knee 08/25/2014.

CLINICAL DATA: Diffuse right knee pain and swelling for 4 months.
No known injury. Subsequent encounter.

EXAM:
MRI OF THE RIGHT KNEE WITHOUT CONTRAST
TECHNIQUE: Multiplanar, multisequence MR imaging of the knee was performed. No
intravenous contrast was administered.

[Series 3: pdfs axial · axial · 3.0mm · 0.19mm/px · z∈[-78,+34]mm · 3 of 32 slices shown]
[im 1/32]
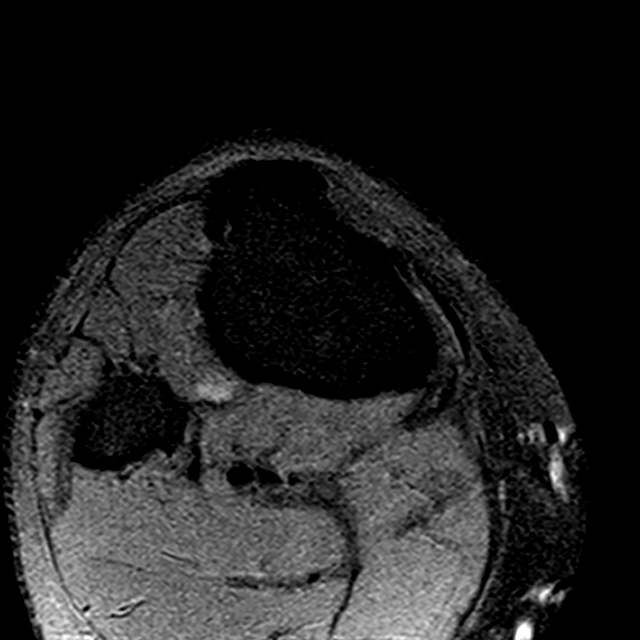
[im 16/32]
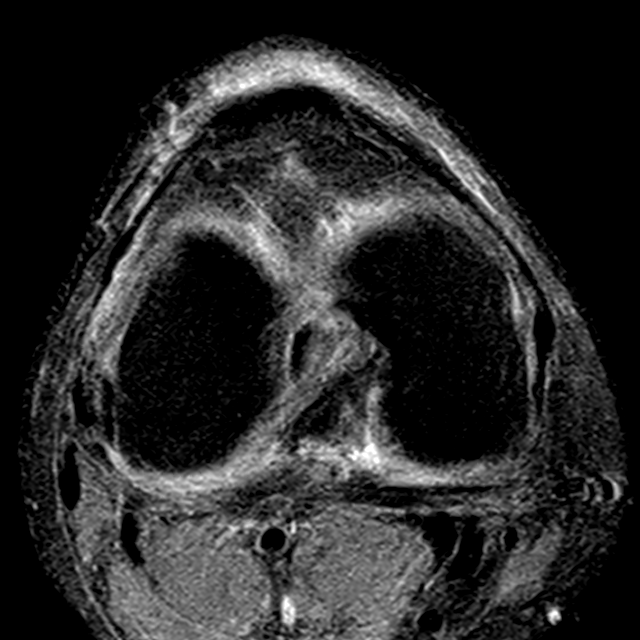
[im 32/32]
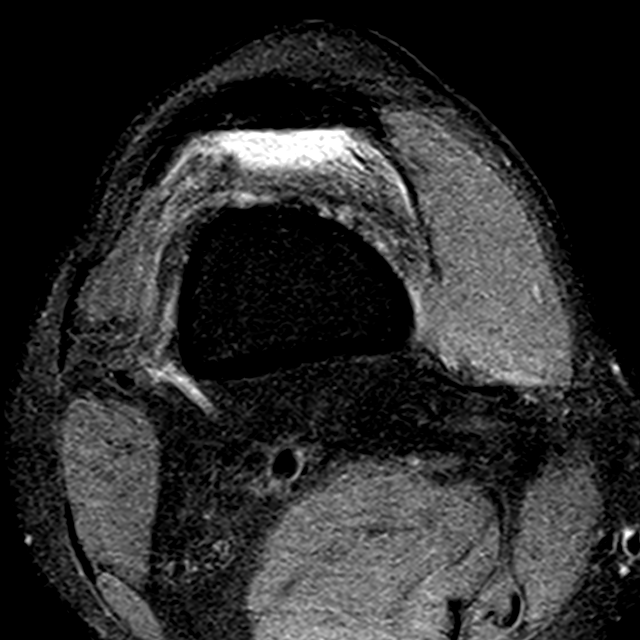

[Series 4: T1 · coronal · 3.0mm · 0.18mm/px · 4 of 30 slices shown]
[im 1/30]
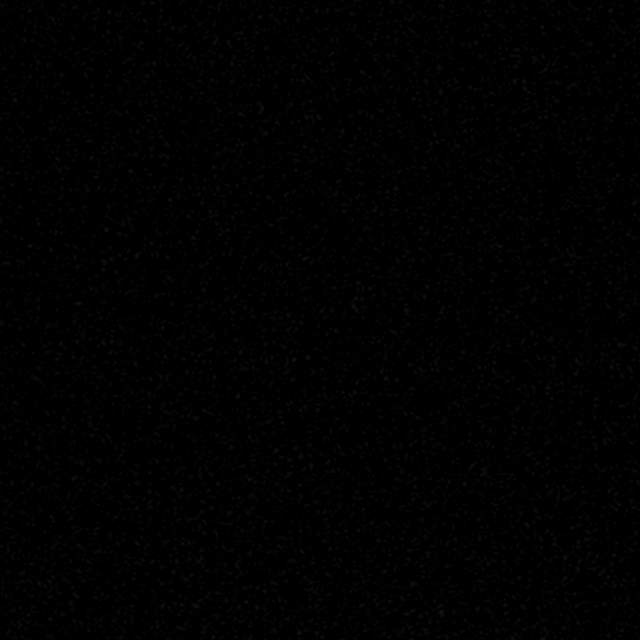
[im 8/30]
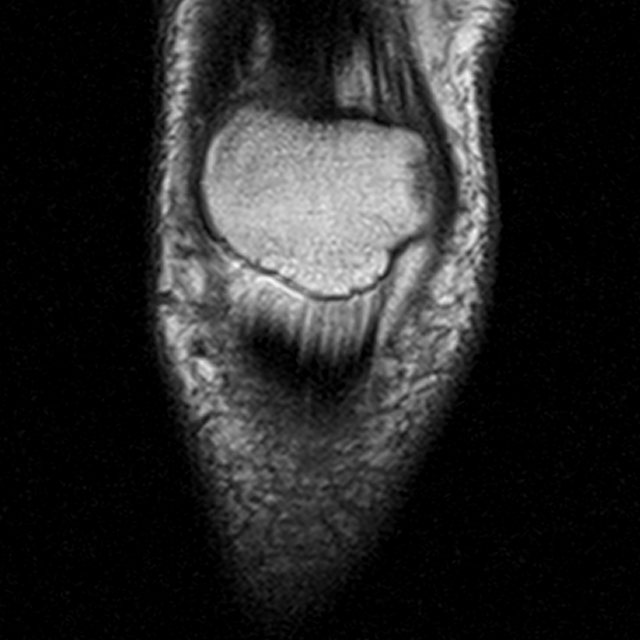
[im 15/30]
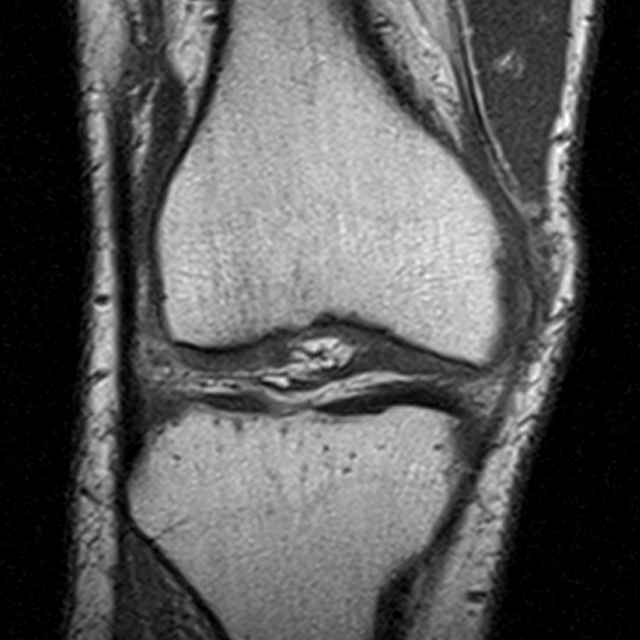
[im 30/30]
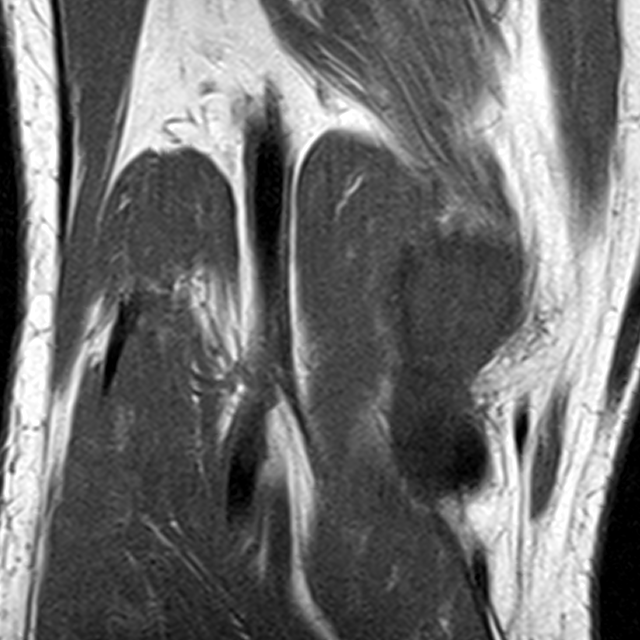

[Series 5: pdfs sag · sagittal · 3.0mm · 0.19mm/px · 3 of 32 slices shown]
[im 7/32]
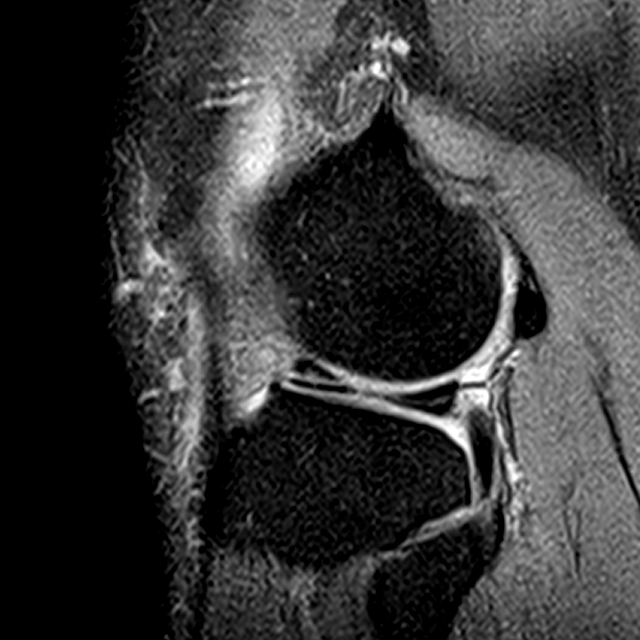
[im 19/32]
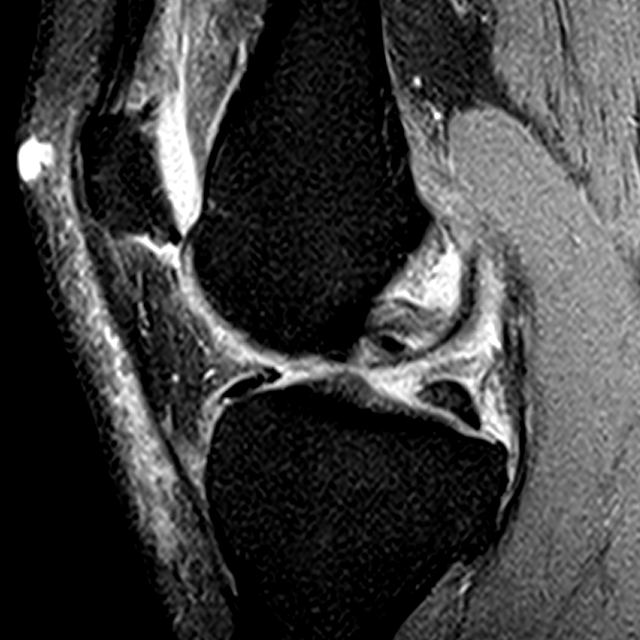
[im 32/32]
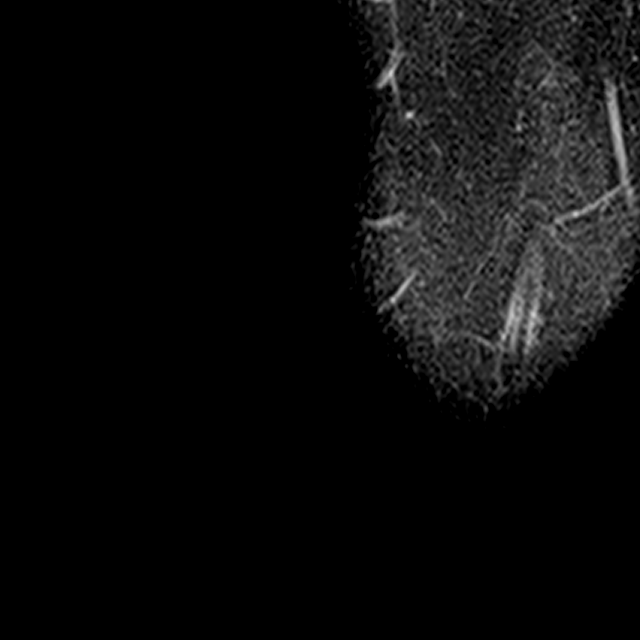

[Series 7: pdfs cor · coronal · 3.0mm · 0.17mm/px · 3 of 30 slices shown]
[im 1/30]
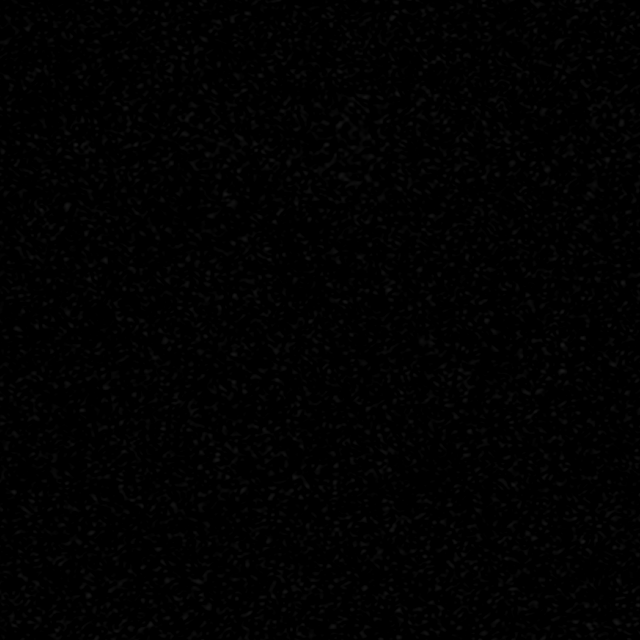
[im 15/30]
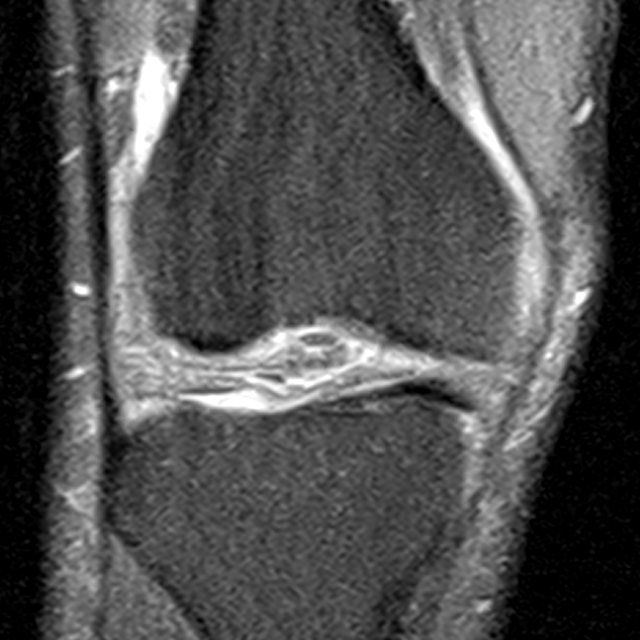
[im 30/30]
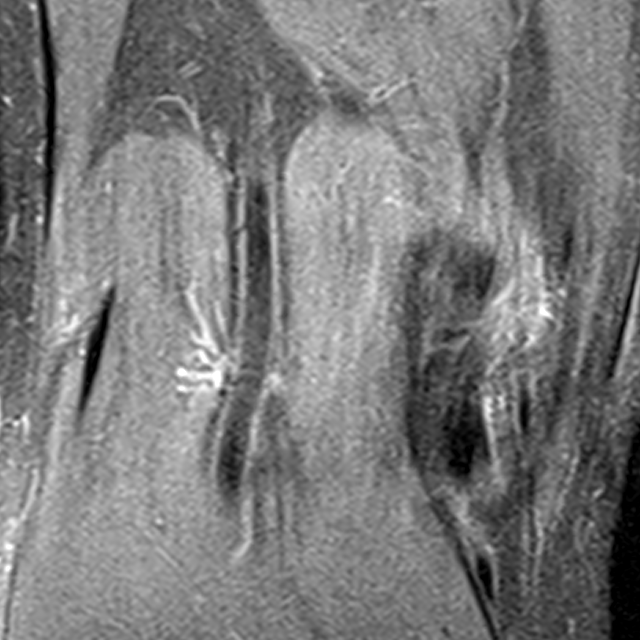

[13 of 40 positions shown; findings below may reference images not displayed]

FINDINGS: MENISCI

Medial meniscus: There is a large horizontal tear of the posterior
horn of the medial meniscus reaching the meniscal undersurface. No
displaced fragment.

Lateral meniscus: Fraying along the free edge of the body of the
lateral meniscus is identified. Horizontal tear reaching the femoral
articular surface is seen at the junction of the anterior horn and
body.

LIGAMENTS

Cruciates: There is some mucoid degeneration of the posterior
cruciate ligament but both the ACL and PCL are intact.

Collaterals:  Intact.

CARTILAGE

Patellofemoral: Hyaline cartilage loss is most notable at the apex
of the patella and along the medial facet.

Medial:  Mildly degenerated.

Lateral:  Mildly degenerated.

Joint:  Small joint effusion is seen.

Popliteal Fossa:  No Baker's cyst.

Extensor Mechanism:  Intact.

Bones: Small osteophytes are seen about the knee. No fracture,
stress change or worrisome marrow lesion.
IMPRESSION: Large horizontal tear posterior horn medial meniscus reaches the
meniscal undersurface. No displaced fragment.

Fraying along the free edge of the body of the lateral meniscus with
a likely horizontal tear at the junction of the anterior horn and
body reaching the femoral articular surface.

Mild appearing degenerative change about the knee.

## 2016-06-19 ENCOUNTER — Other Ambulatory Visit: Payer: Self-pay | Admitting: *Deleted

## 2016-06-19 ENCOUNTER — Telehealth: Payer: Self-pay | Admitting: Orthopedic Surgery

## 2016-06-19 MED ORDER — DICLOFENAC SODIUM 75 MG PO TBEC: 75.0000 mg | DELAYED_RELEASE_TABLET | Freq: Two times a day (BID) | ORAL | 5 refills | Status: DC

## 2016-06-19 NOTE — Telephone Encounter (Signed)
Diclofenac(VOLTAREN) 75 MG EC Tablet  Qty  60 Tablets  Take 1 tablet(75 mg total) by mouth 2 (two) times daily with a meal.

## 2016-06-21 DIAGNOSIS — M25561 Pain in right knee: Secondary | ICD-10-CM | POA: Diagnosis not present

## 2016-06-21 DIAGNOSIS — R944 Abnormal results of kidney function studies: Secondary | ICD-10-CM | POA: Diagnosis not present

## 2016-07-04 DIAGNOSIS — G5602 Carpal tunnel syndrome, left upper limb: Secondary | ICD-10-CM | POA: Diagnosis not present

## 2016-07-11 DIAGNOSIS — G5603 Carpal tunnel syndrome, bilateral upper limbs: Secondary | ICD-10-CM | POA: Diagnosis not present

## 2016-07-18 DIAGNOSIS — G5602 Carpal tunnel syndrome, left upper limb: Secondary | ICD-10-CM | POA: Diagnosis not present

## 2016-07-31 DIAGNOSIS — G5603 Carpal tunnel syndrome, bilateral upper limbs: Secondary | ICD-10-CM | POA: Diagnosis not present

## 2016-12-18 DIAGNOSIS — R05 Cough: Secondary | ICD-10-CM | POA: Diagnosis not present

## 2016-12-18 DIAGNOSIS — J040 Acute laryngitis: Secondary | ICD-10-CM | POA: Diagnosis not present

## 2017-04-17 DIAGNOSIS — J019 Acute sinusitis, unspecified: Secondary | ICD-10-CM | POA: Diagnosis not present

## 2017-04-17 DIAGNOSIS — J060 Acute laryngopharyngitis: Secondary | ICD-10-CM | POA: Diagnosis not present

## 2017-04-17 DIAGNOSIS — Z6831 Body mass index (BMI) 31.0-31.9, adult: Secondary | ICD-10-CM | POA: Diagnosis not present

## 2017-08-23 DIAGNOSIS — M9903 Segmental and somatic dysfunction of lumbar region: Secondary | ICD-10-CM | POA: Diagnosis not present

## 2017-08-23 DIAGNOSIS — M9905 Segmental and somatic dysfunction of pelvic region: Secondary | ICD-10-CM | POA: Diagnosis not present

## 2017-08-28 DIAGNOSIS — M9903 Segmental and somatic dysfunction of lumbar region: Secondary | ICD-10-CM | POA: Diagnosis not present

## 2017-08-28 DIAGNOSIS — M9905 Segmental and somatic dysfunction of pelvic region: Secondary | ICD-10-CM | POA: Diagnosis not present

## 2017-08-29 DIAGNOSIS — M9903 Segmental and somatic dysfunction of lumbar region: Secondary | ICD-10-CM | POA: Diagnosis not present

## 2017-08-29 DIAGNOSIS — M9905 Segmental and somatic dysfunction of pelvic region: Secondary | ICD-10-CM | POA: Diagnosis not present

## 2017-08-30 DIAGNOSIS — M9905 Segmental and somatic dysfunction of pelvic region: Secondary | ICD-10-CM | POA: Diagnosis not present

## 2017-08-30 DIAGNOSIS — M9903 Segmental and somatic dysfunction of lumbar region: Secondary | ICD-10-CM | POA: Diagnosis not present

## 2017-09-04 DIAGNOSIS — M9903 Segmental and somatic dysfunction of lumbar region: Secondary | ICD-10-CM | POA: Diagnosis not present

## 2017-09-04 DIAGNOSIS — M9905 Segmental and somatic dysfunction of pelvic region: Secondary | ICD-10-CM | POA: Diagnosis not present

## 2017-09-05 DIAGNOSIS — M9905 Segmental and somatic dysfunction of pelvic region: Secondary | ICD-10-CM | POA: Diagnosis not present

## 2017-09-05 DIAGNOSIS — M9903 Segmental and somatic dysfunction of lumbar region: Secondary | ICD-10-CM | POA: Diagnosis not present

## 2017-09-06 DIAGNOSIS — M9903 Segmental and somatic dysfunction of lumbar region: Secondary | ICD-10-CM | POA: Diagnosis not present

## 2017-09-06 DIAGNOSIS — M9905 Segmental and somatic dysfunction of pelvic region: Secondary | ICD-10-CM | POA: Diagnosis not present

## 2017-09-10 DIAGNOSIS — M9903 Segmental and somatic dysfunction of lumbar region: Secondary | ICD-10-CM | POA: Diagnosis not present

## 2017-09-10 DIAGNOSIS — M9905 Segmental and somatic dysfunction of pelvic region: Secondary | ICD-10-CM | POA: Diagnosis not present

## 2017-09-12 DIAGNOSIS — M9905 Segmental and somatic dysfunction of pelvic region: Secondary | ICD-10-CM | POA: Diagnosis not present

## 2017-09-12 DIAGNOSIS — M9903 Segmental and somatic dysfunction of lumbar region: Secondary | ICD-10-CM | POA: Diagnosis not present

## 2017-09-13 DIAGNOSIS — M9903 Segmental and somatic dysfunction of lumbar region: Secondary | ICD-10-CM | POA: Diagnosis not present

## 2017-09-13 DIAGNOSIS — M9905 Segmental and somatic dysfunction of pelvic region: Secondary | ICD-10-CM | POA: Diagnosis not present

## 2017-09-17 DIAGNOSIS — M9905 Segmental and somatic dysfunction of pelvic region: Secondary | ICD-10-CM | POA: Diagnosis not present

## 2017-09-17 DIAGNOSIS — M9903 Segmental and somatic dysfunction of lumbar region: Secondary | ICD-10-CM | POA: Diagnosis not present

## 2017-09-19 DIAGNOSIS — M9905 Segmental and somatic dysfunction of pelvic region: Secondary | ICD-10-CM | POA: Diagnosis not present

## 2017-09-19 DIAGNOSIS — M9903 Segmental and somatic dysfunction of lumbar region: Secondary | ICD-10-CM | POA: Diagnosis not present

## 2017-09-20 DIAGNOSIS — M9903 Segmental and somatic dysfunction of lumbar region: Secondary | ICD-10-CM | POA: Diagnosis not present

## 2017-09-20 DIAGNOSIS — M9905 Segmental and somatic dysfunction of pelvic region: Secondary | ICD-10-CM | POA: Diagnosis not present

## 2017-09-24 DIAGNOSIS — M9903 Segmental and somatic dysfunction of lumbar region: Secondary | ICD-10-CM | POA: Diagnosis not present

## 2017-09-24 DIAGNOSIS — M9905 Segmental and somatic dysfunction of pelvic region: Secondary | ICD-10-CM | POA: Diagnosis not present

## 2017-12-13 DIAGNOSIS — Z6831 Body mass index (BMI) 31.0-31.9, adult: Secondary | ICD-10-CM | POA: Diagnosis not present

## 2017-12-13 DIAGNOSIS — E782 Mixed hyperlipidemia: Secondary | ICD-10-CM | POA: Diagnosis not present

## 2017-12-13 DIAGNOSIS — J019 Acute sinusitis, unspecified: Secondary | ICD-10-CM | POA: Diagnosis not present

## 2017-12-13 DIAGNOSIS — E119 Type 2 diabetes mellitus without complications: Secondary | ICD-10-CM | POA: Diagnosis not present

## 2017-12-13 DIAGNOSIS — J060 Acute laryngopharyngitis: Secondary | ICD-10-CM | POA: Diagnosis not present

## 2017-12-13 DIAGNOSIS — I10 Essential (primary) hypertension: Secondary | ICD-10-CM | POA: Diagnosis not present

## 2017-12-13 DIAGNOSIS — Z1329 Encounter for screening for other suspected endocrine disorder: Secondary | ICD-10-CM | POA: Diagnosis not present

## 2017-12-17 DIAGNOSIS — E782 Mixed hyperlipidemia: Secondary | ICD-10-CM | POA: Diagnosis not present

## 2017-12-17 DIAGNOSIS — Z0000 Encounter for general adult medical examination without abnormal findings: Secondary | ICD-10-CM | POA: Diagnosis not present

## 2017-12-17 DIAGNOSIS — I10 Essential (primary) hypertension: Secondary | ICD-10-CM | POA: Diagnosis not present

## 2017-12-17 DIAGNOSIS — E119 Type 2 diabetes mellitus without complications: Secondary | ICD-10-CM | POA: Diagnosis not present

## 2017-12-17 DIAGNOSIS — K219 Gastro-esophageal reflux disease without esophagitis: Secondary | ICD-10-CM | POA: Diagnosis not present

## 2017-12-17 DIAGNOSIS — M109 Gout, unspecified: Secondary | ICD-10-CM | POA: Diagnosis not present

## 2017-12-17 DIAGNOSIS — Z23 Encounter for immunization: Secondary | ICD-10-CM | POA: Diagnosis not present

## 2018-01-04 DIAGNOSIS — K469 Unspecified abdominal hernia without obstruction or gangrene: Secondary | ICD-10-CM | POA: Diagnosis not present

## 2018-04-22 DIAGNOSIS — K219 Gastro-esophageal reflux disease without esophagitis: Secondary | ICD-10-CM | POA: Diagnosis not present

## 2018-04-22 DIAGNOSIS — E785 Hyperlipidemia, unspecified: Secondary | ICD-10-CM | POA: Diagnosis not present

## 2018-04-22 DIAGNOSIS — I10 Essential (primary) hypertension: Secondary | ICD-10-CM | POA: Diagnosis not present

## 2018-04-22 DIAGNOSIS — M1A00X0 Idiopathic chronic gout, unspecified site, without tophus (tophi): Secondary | ICD-10-CM | POA: Diagnosis not present

## 2018-04-22 DIAGNOSIS — E1165 Type 2 diabetes mellitus with hyperglycemia: Secondary | ICD-10-CM | POA: Diagnosis not present

## 2018-06-28 DIAGNOSIS — E785 Hyperlipidemia, unspecified: Secondary | ICD-10-CM | POA: Diagnosis not present

## 2018-06-28 DIAGNOSIS — I10 Essential (primary) hypertension: Secondary | ICD-10-CM | POA: Diagnosis not present

## 2018-06-28 DIAGNOSIS — E119 Type 2 diabetes mellitus without complications: Secondary | ICD-10-CM | POA: Diagnosis not present

## 2018-06-28 DIAGNOSIS — E782 Mixed hyperlipidemia: Secondary | ICD-10-CM | POA: Diagnosis not present

## 2018-06-28 DIAGNOSIS — E1165 Type 2 diabetes mellitus with hyperglycemia: Secondary | ICD-10-CM | POA: Diagnosis not present

## 2018-08-05 ENCOUNTER — Other Ambulatory Visit: Payer: Self-pay

## 2018-12-09 DIAGNOSIS — Z125 Encounter for screening for malignant neoplasm of prostate: Secondary | ICD-10-CM | POA: Diagnosis not present

## 2018-12-09 DIAGNOSIS — E782 Mixed hyperlipidemia: Secondary | ICD-10-CM | POA: Diagnosis not present

## 2018-12-09 DIAGNOSIS — E119 Type 2 diabetes mellitus without complications: Secondary | ICD-10-CM | POA: Diagnosis not present

## 2018-12-09 DIAGNOSIS — E785 Hyperlipidemia, unspecified: Secondary | ICD-10-CM | POA: Diagnosis not present

## 2018-12-09 DIAGNOSIS — E1165 Type 2 diabetes mellitus with hyperglycemia: Secondary | ICD-10-CM | POA: Diagnosis not present

## 2018-12-09 DIAGNOSIS — M109 Gout, unspecified: Secondary | ICD-10-CM | POA: Diagnosis not present

## 2018-12-09 DIAGNOSIS — I10 Essential (primary) hypertension: Secondary | ICD-10-CM | POA: Diagnosis not present

## 2019-01-23 ENCOUNTER — Other Ambulatory Visit: Payer: Self-pay

## 2019-01-23 ENCOUNTER — Ambulatory Visit: Payer: Medicare Other | Attending: Internal Medicine

## 2019-01-23 DIAGNOSIS — Z20822 Contact with and (suspected) exposure to covid-19: Secondary | ICD-10-CM | POA: Diagnosis not present

## 2019-01-24 LAB — NOVEL CORONAVIRUS, NAA: SARS-CoV-2, NAA: DETECTED — AB

## 2019-01-25 ENCOUNTER — Telehealth: Payer: Self-pay | Admitting: Internal Medicine

## 2019-01-25 NOTE — Telephone Encounter (Signed)
I connected by phone with Willie Oneal on 01/25/2019 at 4:34 PM to discuss the potential use of an new treatment for mild to moderate COVID-19 viral infection in non-hospitalized patients. This patient is a 69 y.o. male that meets the FDA criteria for Emergency Use Authorization of bamlanivimab or casirivimab\imdevimab. He meets criteria based on age and with hx of htn and DM. There is some uncertainty on when symptoms began, but notable for him on 1/20 prompting him to get a covid test. His symptoms have been quite mild with only loss of taste.   More detail regarding this FDA emergency use medication has been sent to pt's email account per his request. He has been given the number to follow up with the clinic should he wish to proceed. He verifies understanding that medication must be administered within 10 days of symptom onset.   Alan Ripper, NP-C [place]

## 2020-01-03 DIAGNOSIS — Z8616 Personal history of COVID-19: Secondary | ICD-10-CM

## 2020-01-03 HISTORY — DX: Personal history of COVID-19: Z86.16

## 2020-04-20 DIAGNOSIS — Z712 Person consulting for explanation of examination or test findings: Secondary | ICD-10-CM | POA: Diagnosis not present

## 2020-04-20 DIAGNOSIS — E1165 Type 2 diabetes mellitus with hyperglycemia: Secondary | ICD-10-CM | POA: Diagnosis not present

## 2020-04-20 DIAGNOSIS — Z23 Encounter for immunization: Secondary | ICD-10-CM | POA: Diagnosis not present

## 2020-04-20 DIAGNOSIS — M109 Gout, unspecified: Secondary | ICD-10-CM | POA: Diagnosis not present

## 2020-04-20 DIAGNOSIS — E782 Mixed hyperlipidemia: Secondary | ICD-10-CM | POA: Diagnosis not present

## 2020-04-20 DIAGNOSIS — I10 Essential (primary) hypertension: Secondary | ICD-10-CM | POA: Diagnosis not present

## 2020-04-20 DIAGNOSIS — Z6831 Body mass index (BMI) 31.0-31.9, adult: Secondary | ICD-10-CM | POA: Diagnosis not present

## 2020-04-20 DIAGNOSIS — E119 Type 2 diabetes mellitus without complications: Secondary | ICD-10-CM | POA: Diagnosis not present

## 2020-04-20 DIAGNOSIS — E785 Hyperlipidemia, unspecified: Secondary | ICD-10-CM | POA: Diagnosis not present

## 2020-04-20 DIAGNOSIS — R972 Elevated prostate specific antigen [PSA]: Secondary | ICD-10-CM | POA: Diagnosis not present

## 2020-04-29 ENCOUNTER — Other Ambulatory Visit: Payer: Self-pay | Admitting: Internal Medicine

## 2020-04-29 ENCOUNTER — Other Ambulatory Visit (HOSPITAL_COMMUNITY): Payer: Self-pay | Admitting: Internal Medicine

## 2020-04-29 DIAGNOSIS — R748 Abnormal levels of other serum enzymes: Secondary | ICD-10-CM

## 2020-05-06 ENCOUNTER — Ambulatory Visit (HOSPITAL_COMMUNITY)
Admission: RE | Admit: 2020-05-06 | Discharge: 2020-05-06 | Disposition: A | Discharge status: Home / Self Care | Payer: Medicare Other | Source: Ambulatory Visit | Attending: Internal Medicine | Admitting: Internal Medicine

## 2020-05-06 DIAGNOSIS — K760 Fatty (change of) liver, not elsewhere classified: Secondary | ICD-10-CM | POA: Diagnosis not present

## 2020-05-06 DIAGNOSIS — R748 Abnormal levels of other serum enzymes: Secondary | ICD-10-CM | POA: Insufficient documentation

## 2020-05-06 DIAGNOSIS — N281 Cyst of kidney, acquired: Secondary | ICD-10-CM | POA: Diagnosis not present

## 2020-05-06 DIAGNOSIS — R945 Abnormal results of liver function studies: Secondary | ICD-10-CM | POA: Diagnosis not present

## 2020-07-22 DIAGNOSIS — E782 Mixed hyperlipidemia: Secondary | ICD-10-CM | POA: Diagnosis not present

## 2020-07-22 DIAGNOSIS — E118 Type 2 diabetes mellitus with unspecified complications: Secondary | ICD-10-CM | POA: Diagnosis not present

## 2020-07-22 DIAGNOSIS — R972 Elevated prostate specific antigen [PSA]: Secondary | ICD-10-CM | POA: Diagnosis not present

## 2020-07-22 DIAGNOSIS — Z1329 Encounter for screening for other suspected endocrine disorder: Secondary | ICD-10-CM | POA: Diagnosis not present

## 2020-07-28 DIAGNOSIS — E782 Mixed hyperlipidemia: Secondary | ICD-10-CM | POA: Diagnosis not present

## 2020-07-28 DIAGNOSIS — E1165 Type 2 diabetes mellitus with hyperglycemia: Secondary | ICD-10-CM | POA: Diagnosis not present

## 2020-07-28 DIAGNOSIS — K219 Gastro-esophageal reflux disease without esophagitis: Secondary | ICD-10-CM | POA: Diagnosis not present

## 2020-07-28 DIAGNOSIS — E669 Obesity, unspecified: Secondary | ICD-10-CM | POA: Diagnosis not present

## 2020-07-28 DIAGNOSIS — M109 Gout, unspecified: Secondary | ICD-10-CM | POA: Diagnosis not present

## 2020-07-28 DIAGNOSIS — R972 Elevated prostate specific antigen [PSA]: Secondary | ICD-10-CM | POA: Diagnosis not present

## 2020-07-28 DIAGNOSIS — R809 Proteinuria, unspecified: Secondary | ICD-10-CM | POA: Diagnosis not present

## 2020-07-28 DIAGNOSIS — I10 Essential (primary) hypertension: Secondary | ICD-10-CM | POA: Diagnosis not present

## 2020-07-28 DIAGNOSIS — R197 Diarrhea, unspecified: Secondary | ICD-10-CM | POA: Diagnosis not present

## 2020-07-28 DIAGNOSIS — R945 Abnormal results of liver function studies: Secondary | ICD-10-CM | POA: Diagnosis not present

## 2020-10-28 DIAGNOSIS — E782 Mixed hyperlipidemia: Secondary | ICD-10-CM | POA: Diagnosis not present

## 2020-10-28 DIAGNOSIS — R972 Elevated prostate specific antigen [PSA]: Secondary | ICD-10-CM | POA: Diagnosis not present

## 2020-10-28 DIAGNOSIS — E1165 Type 2 diabetes mellitus with hyperglycemia: Secondary | ICD-10-CM | POA: Diagnosis not present

## 2020-11-09 DIAGNOSIS — E669 Obesity, unspecified: Secondary | ICD-10-CM | POA: Diagnosis not present

## 2020-11-09 DIAGNOSIS — E782 Mixed hyperlipidemia: Secondary | ICD-10-CM | POA: Diagnosis not present

## 2020-11-09 DIAGNOSIS — R197 Diarrhea, unspecified: Secondary | ICD-10-CM | POA: Diagnosis not present

## 2020-11-09 DIAGNOSIS — R809 Proteinuria, unspecified: Secondary | ICD-10-CM | POA: Diagnosis not present

## 2020-11-09 DIAGNOSIS — R972 Elevated prostate specific antigen [PSA]: Secondary | ICD-10-CM | POA: Diagnosis not present

## 2020-11-09 DIAGNOSIS — I10 Essential (primary) hypertension: Secondary | ICD-10-CM | POA: Diagnosis not present

## 2020-11-09 DIAGNOSIS — M109 Gout, unspecified: Secondary | ICD-10-CM | POA: Diagnosis not present

## 2020-11-09 DIAGNOSIS — R945 Abnormal results of liver function studies: Secondary | ICD-10-CM | POA: Diagnosis not present

## 2020-11-09 DIAGNOSIS — E1165 Type 2 diabetes mellitus with hyperglycemia: Secondary | ICD-10-CM | POA: Diagnosis not present

## 2020-11-09 DIAGNOSIS — K219 Gastro-esophageal reflux disease without esophagitis: Secondary | ICD-10-CM | POA: Diagnosis not present

## 2020-11-09 DIAGNOSIS — Z0001 Encounter for general adult medical examination with abnormal findings: Secondary | ICD-10-CM | POA: Diagnosis not present

## 2020-11-17 NOTE — Progress Notes (Signed)
Assessment: 1. Elevated PSA     Plan: Today I had a long discussion with the patient regarding PSA and the rationale and controversies of prostate cancer early detection.  I discussed the pros and cons of further evaluation including TRUS and prostate Bx.  Potential adverse events and complications as well as standard instructions were given.  Patient expressed his understanding of these issues. Schedule for prostate biopsy  Chief Complaint:  Chief Complaint  Patient presents with   Elevated PSA    History of Present Illness:  Willie Oneal is a 70 y.o. year old male who is seen in consultation from Celene Squibb, MD for evaluation of elevated PSA.   PSA results: 7/22 5.8 10/22 6.1 He is not aware of any prior history of elevated PSA.  No history of prostatitis or UTIs.  No family history of prostate cancer.  His only urinary symptom is nocturia x2.  No dysuria or gross hematuria. IPSS = 2 today.  He does have a remote history of a kidney stone 20 years ago.   Past Medical History:  Past Medical History:  Diagnosis Date   Diabetes mellitus    GERD (gastroesophageal reflux disease)    HTN (hypertension)    Hyperlipidemia     Past Surgical History:  Past Surgical History:  Procedure Laterality Date   COLONOSCOPY N/A 07/21/2015   Procedure: COLONOSCOPY;  Surgeon: Rogene Houston, MD;  Location: [facility];  Service: Endoscopy;  Laterality: N/A;  730   SHOULDER SURGERY     TONSILLECTOMY      Allergies:  No Known Allergies  Family History:  No family history on file.  Social History:  Social History   Tobacco Use   Smoking status: Former   Smokeless tobacco: Never  Substance Use Topics   Alcohol use: No   Drug use: No    Review of symptoms:  Constitutional:  Negative for unexplained weight loss, night sweats, fever, chills ENT:  Negative for nose bleeds, sinus pain, painful swallowing CV:  Negative for chest pain, shortness of breath, exercise  intolerance, palpitations, loss of consciousness Resp:  Negative for cough, wheezing, shortness of breath GI:  Negative for nausea, vomiting, diarrhea, bloody stools GU:  Positives noted in HPI; otherwise negative for gross hematuria, dysuria, urinary incontinence Neuro:  Negative for seizures, poor balance, limb weakness, slurred speech Psych:  Negative for lack of energy, depression, anxiety Endocrine:  Negative for polydipsia, polyuria, symptoms of hypoglycemia (dizziness, hunger, sweating) Hematologic:  Negative for anemia, purpura, petechia, prolonged or excessive bleeding, use of anticoagulants  Allergic:  Negative for difficulty breathing or choking as a result of exposure to anything; no shellfish allergy; no allergic response (rash/itch) to materials, foods  Physical exam: BP (!) 171/75   Pulse 84   Temp 98 F (36.7 C)   Wt 213 lb (96.6 kg)   BMI 30.13 kg/m  GENERAL APPEARANCE:  Well appearing, well developed, well nourished, NAD HEENT: Atraumatic, Normocephalic, oropharynx clear. NECK: Supple without lymphadenopathy or thyromegaly. LUNGS: Clear to auscultation bilaterally. HEART: Regular Rate and Rhythm without murmurs, gallops, or rubs. ABDOMEN: Soft, non-tender, No Masses. EXTREMITIES: Moves all extremities well.  Without clubbing, cyanosis, or edema. NEUROLOGIC:  Alert and oriented x 3, normal gait, CN II-XII grossly intact.  MENTAL STATUS:  Appropriate. BACK:  Non-tender to palpation.  No CVAT SKIN:  Warm, dry and intact.   GU: Penis:  circumcised Meatus: Normal Scrotum: normal, no masses Testis: normal without masses bilateral Epididymis:  normal Prostate: 40 g, NT, no nodules Rectum: Normal tone,  no masses or tenderness   Results: U/A:  0-2 RBC, 3+ protein, 3+ glucose

## 2020-11-18 ENCOUNTER — Ambulatory Visit (INDEPENDENT_AMBULATORY_CARE_PROVIDER_SITE_OTHER): Payer: Medicare Other | Admitting: Urology

## 2020-11-18 ENCOUNTER — Encounter: Payer: Self-pay | Admitting: Urology

## 2020-11-18 ENCOUNTER — Other Ambulatory Visit: Payer: Self-pay

## 2020-11-18 VITALS — BP 171/75 | HR 84 | Temp 98.0°F | Wt 213.0 lb

## 2020-11-18 DIAGNOSIS — R972 Elevated prostate specific antigen [PSA]: Secondary | ICD-10-CM | POA: Diagnosis not present

## 2020-11-18 NOTE — Patient Instructions (Signed)
   Appointment Time: Arrive at 8:00am Appointment Date: November 28  Location: [facility]   Prostate Biopsy Instructions  Stop all aspirin or blood thinners (aspirin, plavix, coumadin, warfarin, motrin, ibuprofen, advil, aleve, naproxen, naprosyn) for 7 days prior to the procedure.  If you have any questions about stopping these medications, please contact your primary care physician or cardiologist.  Having a light meal prior to the procedure is recommended.  If you are diabetic or have low blood sugar please bring a small snack or glucose tablet.  A Fleets enema is needed to be purchased over the counter at a local pharmacy and used 2 hours before you scheduled appointment.  This can be purchased over the counter at any pharmacy.  Antibiotics will be administered in the clinic at the time of the procedure.   Please bring someone with you to the procedure to drive you home if you are given a valium to take prior to your procedure.   If you have any questions or concerns, please feel free to call the office at [phone] or send a Mychart message.    Thank you, [facility]

## 2020-11-18 NOTE — Progress Notes (Signed)
Urological Symptom Review  Patient is experiencing the following symptoms: Get up at night to urinate Erection problems (male only)   Review of Systems  Gastrointestinal (upper)  : Negative for upper GI symptoms  Gastrointestinal (lower) : Diarrhea  Constitutional : Negative for symptoms  Skin: Negative for skin symptoms  Eyes: Negative for eye symptoms  Ear/Nose/Throat : Negative for Ear/Nose/Throat symptoms  Hematologic/Lymphatic: Negative for Hematologic/Lymphatic symptoms  Cardiovascular : Negative for cardiovascular symptoms  Respiratory : Negative for respiratory symptoms  Endocrine: Negative for endocrine symptoms  Musculoskeletal: Negative for musculoskeletal symptoms  Neurological: Negative for neurological symptoms  Psychologic: Negative for psychiatric symptoms

## 2020-11-19 DIAGNOSIS — R972 Elevated prostate specific antigen [PSA]: Secondary | ICD-10-CM | POA: Diagnosis not present

## 2020-11-19 LAB — MICROSCOPIC EXAMINATION
Bacteria, UA: NONE SEEN
Epithelial Cells (non renal): NONE SEEN /hpf (ref 0–10)
Renal Epithel, UA: NONE SEEN /hpf
WBC, UA: NONE SEEN /hpf (ref 0–5)

## 2020-11-19 LAB — URINALYSIS, ROUTINE W REFLEX MICROSCOPIC
Bilirubin, UA: NEGATIVE
Ketones, UA: NEGATIVE
Leukocytes,UA: NEGATIVE
Nitrite, UA: NEGATIVE
Specific Gravity, UA: >1.03 — ABNORMAL HIGH (ref 1.005–1.030)
Urobilinogen, Ur: 0.2 mg/dL (ref 0.2–1.0)
pH, UA: 5 (ref 5.0–7.5)

## 2020-11-22 ENCOUNTER — Telehealth: Payer: Self-pay

## 2020-11-22 DIAGNOSIS — R972 Elevated prostate specific antigen [PSA]: Secondary | ICD-10-CM

## 2020-11-22 NOTE — Telephone Encounter (Signed)
Patient called and left a vm advising he needed to reschedule his Biopsy for 11/29/2020. He advised he needed it to be on a Wednesday, Thursday, or Friday.

## 2020-11-23 NOTE — Telephone Encounter (Signed)
Returned patient call. Biopsy date changed to Dec 8th per patient request. Scheduling sheet faxed to centralized scheduling.

## 2020-11-29 ENCOUNTER — Other Ambulatory Visit: Payer: Medicare Other | Admitting: Urology

## 2020-12-07 ENCOUNTER — Ambulatory Visit: Payer: Medicare Other | Admitting: Urology

## 2020-12-09 ENCOUNTER — Encounter: Payer: Self-pay | Admitting: Urology

## 2020-12-09 ENCOUNTER — Other Ambulatory Visit: Payer: Self-pay | Admitting: Urology

## 2020-12-09 ENCOUNTER — Encounter (HOSPITAL_COMMUNITY): Payer: Self-pay

## 2020-12-09 ENCOUNTER — Other Ambulatory Visit: Payer: Self-pay

## 2020-12-09 ENCOUNTER — Ambulatory Visit (INDEPENDENT_AMBULATORY_CARE_PROVIDER_SITE_OTHER): Payer: Medicare Other | Admitting: Urology

## 2020-12-09 ENCOUNTER — Ambulatory Visit (HOSPITAL_COMMUNITY)
Admission: RE | Admit: 2020-12-09 | Discharge: 2020-12-09 | Disposition: A | Discharge status: Home / Self Care | Payer: Medicare Other | Source: Ambulatory Visit | Attending: Urology | Admitting: Urology

## 2020-12-09 DIAGNOSIS — C61 Malignant neoplasm of prostate: Secondary | ICD-10-CM | POA: Diagnosis not present

## 2020-12-09 DIAGNOSIS — R972 Elevated prostate specific antigen [PSA]: Secondary | ICD-10-CM

## 2020-12-09 MED ORDER — LIDOCAINE HCL (PF) 2 % IJ SOLN
INTRAMUSCULAR | Status: AC
  Administered 2020-12-09: 10 mL
  Filled 2020-12-09: qty 10

## 2020-12-09 MED ORDER — LIDOCAINE HCL (PF) 1 % IJ SOLN
INTRAMUSCULAR | Status: AC
  Administered 2020-12-09: 2.1 mL via INTRADERMAL
  Filled 2020-12-09: qty 5

## 2020-12-09 MED ORDER — LIDOCAINE HCL (PF) 1 % IJ SOLN: 2.1000 mL | Freq: Once | INTRAMUSCULAR | Status: AC

## 2020-12-09 MED ORDER — LIDOCAINE HCL (PF) 2 % IJ SOLN: 10.0000 mL | Freq: Once | INTRAMUSCULAR | Status: AC

## 2020-12-09 MED ORDER — CEFTRIAXONE SODIUM 1 G IJ SOLR
INTRAMUSCULAR | Status: AC
  Administered 2020-12-09: 1 g via INTRAMUSCULAR
  Filled 2020-12-09: qty 10

## 2020-12-09 MED ORDER — CEFTRIAXONE SODIUM 1 G IJ SOLR: 1.0000 g | Freq: Once | INTRAMUSCULAR | Status: AC

## 2020-12-09 NOTE — Progress Notes (Signed)
Assessment: 1. Elevated PSA     Plan: Post biopsy instructions given. Return to office in 7-10 days to discuss results.  Chief Complaint:  Chief Complaint  Patient presents with   Elevated PSA     History of Present Illness:  Willie Oneal is a 70 y.o. year old male who is seen for further evaluation of elevated PSA.   PSA results: 7/22 5.8 10/22 6.1 He is not aware of any prior history of elevated PSA.  No history of prostatitis or UTIs.  No family history of prostate cancer.  His only urinary symptom is nocturia x2.  No dysuria or gross hematuria. IPSS = 2 today.  He does have a remote history of a kidney stone 20 years ago.  He presents today for a prostate biopsy.  Portions of the above documentation were copied from a prior visit for review purposes only.   Past Medical History:  Past Medical History:  Diagnosis Date   Diabetes mellitus    GERD (gastroesophageal reflux disease)    HTN (hypertension)    Hyperlipidemia     Past Surgical History:  Past Surgical History:  Procedure Laterality Date   COLONOSCOPY N/A 07/21/2015   Procedure: COLONOSCOPY;  Surgeon: Rogene Houston, MD;  [facility];  Service: Endoscopy;  Laterality: N/A;  730   SHOULDER SURGERY     TONSILLECTOMY      Allergies:  No Known Allergies  Family History:  No family history on file.  Social History:  Social History   Tobacco Use   Smoking status: Former   Smokeless tobacco: Never  Scientific laboratory technician Use: Never used  Substance Use Topics   Alcohol use: No   Drug use: No    ROS: Constitutional:  Negative for fever, chills, weight loss CV: Negative for chest pain, previous MI, hypertension Respiratory:  Negative for shortness of breath, wheezing, sleep apnea, frequent cough GI:  Negative for nausea, vomiting, bloody stool, GERD  Physical exam: GENERAL APPEARANCE:  Well appearing, well developed, well nourished, NAD HEENT:  Atraumatic, normocephalic,  oropharynx clear NECK:  Supple without lymphadenopathy or thyromegaly ABDOMEN:  Soft, non-tender, no masses EXTREMITIES:  Moves all extremities well, without clubbing, cyanosis, or edema NEUROLOGIC:  Alert and oriented x 3, normal gait, CN II-XII grossly intact MENTAL STATUS:  appropriate BACK:  Non-tender to palpation, No CVAT SKIN:  Warm, dry, and intact   Results: None  TRANSRECTAL ULTRASOUND AND PROSTATE BIOPSY  Indication:  Elevated PSA  Prophylactic antibiotic administration: Rocephin  All medications that could result in increased bleeding were discontinued within an appropriate period of the time of biopsy.  Risk including bleeding and infection were discussed.  Informed consent was obtained.  The patient was placed in the left lateral decubitus position.  PROCEDURE 1.  TRANSRECTAL ULTRASOUND OF THE PROSTATE  The 7 MHz transrectal probe was used to image the prostate.  Anal stenosis was not noted.  TRUS volume: 48.08 ml  Hypoechoic areas: None  Hyperechoic areas: None  Central calcifications: present  Margins:  normal   PROCEDURE 2:  PROSTATE BIOPSY  A periprostatic block was performed using 1% lidocaine and transrectal ultrasound guidance. Under transrectal ultrasound guidance, and using the Biopty gun, prostate biopsies were obtained systematically from the apex, mid gland, and base bilaterally.  A total of 12 cores were obtained.  Hemostasis was obtained with gentle pressure on the prostate.  The procedures were well-tolerated.  No significant bleeding was noted at the  end of the procedure.  The patient was stable for discharge from the office.

## 2020-12-09 NOTE — Progress Notes (Signed)
PT tolerated prostate biopsy procedure and antibiotic injection well today. Labs obtained and sent for pathology by ultrasound tech at 0835. PT ambulatory at discharge with no acute distress noted and verbalized understanding of discharge instructions. PT to follow up with urologist as scheduled on 12/14/20 at 3:45pm.

## 2020-12-14 ENCOUNTER — Ambulatory Visit (INDEPENDENT_AMBULATORY_CARE_PROVIDER_SITE_OTHER): Payer: Medicare Other | Admitting: Urology

## 2020-12-14 ENCOUNTER — Other Ambulatory Visit: Payer: Self-pay

## 2020-12-14 ENCOUNTER — Encounter: Payer: Self-pay | Admitting: Urology

## 2020-12-14 DIAGNOSIS — C61 Malignant neoplasm of prostate: Secondary | ICD-10-CM

## 2020-12-14 NOTE — Progress Notes (Signed)
Assessment: 1. Prostate cancer ([place]); T1cNxMx; Gleason grade group 1 & 2; favorable intermediate risk    Plan: I spent a total of 50 minutes counseling Willie Oneal regarding the diagnosis of localized prostate cancer.  I spent the first 20 minutes discussing the biopsy results.  Using the prostate cancer nomogram, I sided a probability of 60 percent for localized prostate cancer, 39 percent of extracapsular extension, 4 percent of seminal vesicle involvement, and 3 percent of lymph node involvement.  I discussed the diagnosis of localized prostate cancer in the natural history of prostate cancer. I then spent the next 30 minutes discussing treatment options for localized prostate cancer.  Specifically, I discussed active surveillance, radical prostatectomy (RALP, RRP, RPP), external beam radiation, low dose rate brachytherapy, cryosurgery, and high intensity frequency ultrasound.  I discussed the risk and benefits of each treatment.  I discussed the potential risk of impotence and incontinence as they relate to treatment of prostate cancer.  Questions were answered.  The patient was given literature regarding prostate cancer to review.   Discussed evaluation with Oncotype DX - he will call if he wishes to proceed Return to office in 3 weeks  Chief Complaint:  Chief Complaint  Patient presents with   Prostate Cancer    History of Present Illness:  Willie Oneal is a 70 y.o. year old male who is seen for further evaluation of newly diagnosed prostate cancer.   PSA results: 7/22 5.8 10/22 6.1 He is not aware of any prior history of elevated PSA.  No history of prostatitis or UTIs.  No family history of prostate cancer.  His only urinary symptom is nocturia x 2.  No dysuria or gross hematuria. IPSS = 2.  He does have a remote history of a kidney stone 20 years ago.  He returns today following his transrectal ultrasound and biopsy of the prostate on 12/09/20. PSA: 5.8 ng/ml TRUS volume:   48.08 ml  Biopsy results:             Gleason score: 3+ 3 = 6, 3 + 4 = 7            # positive cores: 3/6 on right    1/6 on left            Location of cancer: mid gland, base  Complications after biopsy: none Patient without significant LUTS.    Patient with erectile dysfunction.     Portions of the above documentation were copied from a prior visit for review purposes only.   Past Medical History:  Past Medical History:  Diagnosis Date   Diabetes mellitus    GERD (gastroesophageal reflux disease)    HTN (hypertension)    Hyperlipidemia     Past Surgical History:  Past Surgical History:  Procedure Laterality Date   COLONOSCOPY N/A 07/21/2015   Procedure: COLONOSCOPY;  Surgeon: Rogene Houston, MD;  [facility];  Service: Endoscopy;  Laterality: N/A;  730   SHOULDER SURGERY     TONSILLECTOMY      Allergies:  No Known Allergies  Family History:  No family history on file.  Social History:  Social History   Tobacco Use   Smoking status: Former   Smokeless tobacco: Never  Scientific laboratory technician Use: Never used  Substance Use Topics   Alcohol use: No   Drug use: No    ROS: Constitutional:  Negative for fever, chills, weight loss CV: Negative for chest pain, previous  MI, hypertension Respiratory:  Negative for shortness of breath, wheezing, sleep apnea, frequent cough GI:  Negative for nausea, vomiting, bloody stool, GERD  Physical exam: GENERAL APPEARANCE:  Well appearing, well developed, well nourished, NAD HEENT:  Atraumatic, normocephalic, oropharynx clear NECK:  Supple without lymphadenopathy or thyromegaly ABDOMEN:  Soft, non-tender, no masses EXTREMITIES:  Moves all extremities well, without clubbing, cyanosis, or edema NEUROLOGIC:  Alert and oriented x 3, normal gait, CN II-XII grossly intact MENTAL STATUS:  appropriate BACK:  Non-tender to palpation, No CVAT SKIN:  Warm, dry, and intact   Results: None

## 2020-12-17 DIAGNOSIS — U071 COVID-19: Secondary | ICD-10-CM | POA: Diagnosis not present

## 2020-12-20 ENCOUNTER — Telehealth: Payer: Self-pay

## 2020-12-20 NOTE — Telephone Encounter (Signed)
Patient called and advised that he wanted to proceed and schedule to have the F[employer] Markers placed. I advised patient that someone would be in conctact with him to schedule same.

## 2020-12-21 NOTE — Telephone Encounter (Signed)
FYI-see below- 

## 2020-12-28 ENCOUNTER — Other Ambulatory Visit: Payer: Self-pay | Admitting: Urology

## 2020-12-28 DIAGNOSIS — C61 Malignant neoplasm of prostate: Secondary | ICD-10-CM

## 2021-01-04 NOTE — Telephone Encounter (Signed)
Patient scheduled.

## 2021-01-06 ENCOUNTER — Encounter: Payer: Self-pay | Admitting: Urology

## 2021-01-06 ENCOUNTER — Other Ambulatory Visit: Payer: Self-pay

## 2021-01-06 ENCOUNTER — Ambulatory Visit (INDEPENDENT_AMBULATORY_CARE_PROVIDER_SITE_OTHER): Payer: 59 | Admitting: Urology

## 2021-01-06 VITALS — BP 168/90 | HR 86 | Ht 70.0 in | Wt 210.0 lb

## 2021-01-06 DIAGNOSIS — C61 Malignant neoplasm of prostate: Secondary | ICD-10-CM

## 2021-01-06 LAB — URINALYSIS, ROUTINE W REFLEX MICROSCOPIC
Bilirubin, UA: NEGATIVE
Leukocytes,UA: NEGATIVE
Nitrite, UA: NEGATIVE
Specific Gravity, UA: >1.03 — ABNORMAL HIGH (ref 1.005–1.030)
Urobilinogen, Ur: 0.2 mg/dL (ref 0.2–1.0)
pH, UA: 5 (ref 5.0–7.5)

## 2021-01-06 LAB — MICROSCOPIC EXAMINATION
Bacteria, UA: NONE SEEN
Epithelial Cells (non renal): NONE SEEN /hpf (ref 0–10)
Renal Epithel, UA: NONE SEEN /hpf
WBC, UA: NONE SEEN /hpf (ref 0–5)

## 2021-01-06 NOTE — Progress Notes (Signed)
Assessment: 1. Prostate cancer ([place]); T1cNxMx; Gleason grade group 1 & 2; favorable intermediate risk      Plan: I reviewed the biopsy results and management options for favorable intermediate risk prostate cancer.  He is interested in low-dose rate brachytherapy.  He is scheduled to see Dr. Tammi Klippel later this month. Return to office 1 month after treatment.  Chief Complaint:  Chief Complaint  Patient presents with   Prostate Cancer   History of Present Illness:  Willie Oneal is a 71 y.o. year old male who is seen for further evaluation of newly diagnosed prostate cancer.   PSA results: 7/22 5.8 10/22 6.1 He is not aware of any prior history of elevated PSA.  No history of prostatitis or UTIs.  No family history of prostate cancer.  His only urinary symptom is nocturia x 2.  No dysuria or gross hematuria. IPSS = 2.  He does have a remote history of a kidney stone 20 years ago.  He returns today following his transrectal ultrasound and biopsy of the prostate on 12/09/20. PSA: 5.8 ng/ml TRUS volume:  48.08 ml  Biopsy results:             Gleason score: 3+ 3 = 6, 3 + 4 = 7            # positive cores: 3/6 on right    1/6 on left            Location of cancer: mid gland, base  Complications after biopsy: none Patient without significant LUTS.    Patient with erectile dysfunction.    Pathology results and treatment options were discussed with the patient in detail at his visit on 12/14/2020.  Following the discussion, he elected to pursue brachytherapy.  He is scheduled to see Dr. Tammi Klippel on 01/26/21. No new LUTS.  No hematuria or dysuria.  Portions of the above documentation were copied from a prior visit for review purposes only.   Past Medical History:  Past Medical History:  Diagnosis Date   Diabetes mellitus    GERD (gastroesophageal reflux disease)    HTN (hypertension)    Hyperlipidemia     Past Surgical History:  Past Surgical History:  Procedure Laterality  Date   COLONOSCOPY N/A 07/21/2015   Procedure: COLONOSCOPY;  Surgeon: Rogene Houston, MD;  [facility];  Service: Endoscopy;  Laterality: N/A;  730   SHOULDER SURGERY     TONSILLECTOMY      Allergies:  No Known Allergies  Family History:  History reviewed. No pertinent family history.  Social History:  Social History   Tobacco Use   Smoking status: Former   Smokeless tobacco: Never  Scientific laboratory technician Use: Never used  Substance Use Topics   Alcohol use: No   Drug use: No    ROS: Constitutional:  Negative for fever, chills, weight loss CV: Negative for chest pain, previous MI, hypertension Respiratory:  Negative for shortness of breath, wheezing, sleep apnea, frequent cough GI:  Negative for nausea, vomiting, bloody stool, GERD  Physical exam: BP (!) 168/90 (BP Location: Left Arm)    Pulse 86    Ht 5\' 10"  (1.778 m)    Wt 210 lb (95.3 kg)    BMI 30.13 kg/m  GENERAL APPEARANCE:  Well appearing, well developed, well nourished, NAD HEENT:  Atraumatic, normocephalic, oropharynx clear NECK:  Supple without lymphadenopathy or thyromegaly ABDOMEN:  Soft, non-tender, no masses EXTREMITIES:  Moves all extremities well, without  clubbing, cyanosis, or edema NEUROLOGIC:  Alert and oriented x 3, normal gait, CN II-XII grossly intact MENTAL STATUS:  appropriate BACK:  Non-tender to palpation, No CVAT SKIN:  Warm, dry, and intact   Results: U/A:  0-2 RBC

## 2021-01-13 NOTE — Progress Notes (Signed)
GU Location of Tumor / Histology: Prostate Ca  If Prostate Cancer, Gleason Score is (3 + 3) and PSA is (6.1 on 10/22 and 5.8 on 7/22)  Biopsies:  Dr. Felipa Eth    Transrectal Ultrasound and Biopsy of the prostate on 12/10/2210/8/22  PSA: 5.8 ng/ml TRUS volume:  48.08 ml  Biopsy results:             Gleason score: 3+ 3 = 6, 3 + 4 = 7            # positive cores: 3/6 on right    1/6 on left            Location of cancer: mid gland, base   Complications after biopsy: none Patient without significant LUTS.    Patient with erectile dysfunction.    Past/Anticipated interventions by urology, if any:   The biopsy results and management options for favorable intermediate risk prostate cancer.  He is interested in low-dose rate brachytherapy.  Weight changes, if any:  No  IPSS:  6 SHIM:   5  Bowel/Bladder complaints, if any:  Diarrhea not taking anything at this time.   Bladder up q2 hours.  Nausea/Vomiting, if any:  No  Pain issues, if any:  0/10  SAFETY ISSUES: Prior radiation?  No Pacemaker/ICD? No Possible current pregnancy? Male Is the patient on methotrexate?  No  Current Complaints / other details:  Need more information on treatment options.

## 2021-01-26 ENCOUNTER — Ambulatory Visit
Admission: RE | Admit: 2021-01-26 | Discharge: 2021-01-26 | Disposition: A | Discharge status: Home / Self Care | Payer: Medicare Other | Source: Ambulatory Visit | Attending: Radiation Oncology | Admitting: Radiation Oncology

## 2021-01-26 ENCOUNTER — Other Ambulatory Visit: Payer: Self-pay

## 2021-01-26 VITALS — BP 167/93 | HR 87 | Temp 97.8°F | Resp 18 | Ht 70.0 in | Wt 204.0 lb

## 2021-01-26 DIAGNOSIS — C61 Malignant neoplasm of prostate: Secondary | ICD-10-CM | POA: Diagnosis not present

## 2021-01-26 DIAGNOSIS — K219 Gastro-esophageal reflux disease without esophagitis: Secondary | ICD-10-CM | POA: Diagnosis not present

## 2021-01-26 DIAGNOSIS — Z7984 Long term (current) use of oral hypoglycemic drugs: Secondary | ICD-10-CM | POA: Insufficient documentation

## 2021-01-26 DIAGNOSIS — E785 Hyperlipidemia, unspecified: Secondary | ICD-10-CM | POA: Diagnosis not present

## 2021-01-26 DIAGNOSIS — Z87891 Personal history of nicotine dependence: Secondary | ICD-10-CM | POA: Diagnosis not present

## 2021-01-26 DIAGNOSIS — Z79899 Other long term (current) drug therapy: Secondary | ICD-10-CM | POA: Insufficient documentation

## 2021-01-26 DIAGNOSIS — E119 Type 2 diabetes mellitus without complications: Secondary | ICD-10-CM | POA: Diagnosis not present

## 2021-01-26 DIAGNOSIS — I10 Essential (primary) hypertension: Secondary | ICD-10-CM | POA: Diagnosis not present

## 2021-01-26 NOTE — Progress Notes (Signed)
Radiation Oncology         [phone] ________________________________  Initial Outpatient Consultation  Name: Willie Oneal MRN: [id number]  Date: 01/26/2021  DOB: 12/13/1950  OV:ZCHY, Willie Areola, MD  Primus Bravo., *   REFERRING PHYSICIAN: Primus Bravo., *  DIAGNOSIS: 72 y.o. gentleman with Stage T1c adenocarcinoma of the prostate with Gleason score of 3+4, and PSA of 6.1.    ICD-10-CM   1. Prostate cancer ([name]); T1cNxMx; Gleason grade group 1 & 2; favorable intermediate risk  C61       HISTORY OF PRESENT ILLNESS: Willie Oneal is a 71 y.o. male with a diagnosis of prostate cancer. He was noted to have an elevated PSA of 5.8 by his primary care physician, Dr. Allyn Kenner.  A repeat PSA performed in October 2022 remained elevated at 6.1.  Accordingly, he was referred for evaluation in urology by Dr. Joelene Millin on 11/18/2020,  digital rectal examination was performed at that time revealing no discrete nodularity or concerning findings.  The patient proceeded to transrectal ultrasound with 12 biopsies of the prostate on 12/09/2020.  The prostate volume measured 48.08 cc.  Out of 12 core biopsies, 4 were positive.  The maximum Gleason score was 3+4, and this was seen in the left mid lateral.  Additionally, Gleason 3+3 disease was seen in the right base lateral, right mid and right mid lateral.  The patient reviewed the biopsy results with his urologist and he has kindly been referred today for discussion of potential radiation treatment options.   PREVIOUS RADIATION THERAPY: No  PAST MEDICAL HISTORY:  Past Medical History:  Diagnosis Date   Diabetes mellitus    GERD (gastroesophageal reflux disease)    HTN (hypertension)    Hyperlipidemia       PAST SURGICAL HISTORY: Past Surgical History:  Procedure Laterality Date   COLONOSCOPY N/A 07/21/2015   Procedure: COLONOSCOPY;  Surgeon: Rogene Houston, MD;  [facility];  Service: Endoscopy;  Laterality: N/A;   730   SHOULDER SURGERY     TONSILLECTOMY      FAMILY HISTORY: No family history on file.  SOCIAL HISTORY:  Social History   Socioeconomic History   Marital status: Married    Spouse name: Not on file   Number of children: Not on file   Years of education: Not on file   Highest education level: Not on file  Occupational History   Not on file  Tobacco Use   Smoking status: Former   Smokeless tobacco: Never  Vaping Use   Vaping Use: Never used  Substance and Sexual Activity   Alcohol use: No   Drug use: No   Sexual activity: Not on file  Other Topics Concern   Not on file  Social History Narrative   Not on file   Social Determinants of Health   Financial Resource Strain: Not on file  Food Insecurity: Not on file  Transportation Needs: Not on file  Physical Activity: Not on file  Stress: Not on file  Social Connections: Not on file  Intimate Partner Violence: Not on file    ALLERGIES: Patient has no known allergies.  MEDICATIONS:  Current Outpatient Medications  Medication Sig Dispense Refill   allopurinol (ZYLOPRIM) 100 MG tablet Take 100 mg by mouth daily.     atenolol (TENORMIN) 50 MG tablet Take 50 mg by mouth daily.     glipiZIDE (GLUCOTROL XL) 10 MG 24 hr tablet Take 10 mg by mouth  daily with breakfast.     linaGLIPtin-metFORMIN HCl 2.05-998 MG TABS Take 1 tablet by mouth daily.     lisinopril (PRINIVIL,ZESTRIL) 10 MG tablet Take 10 mg by mouth daily.     metFORMIN (GLUCOPHAGE-XR) 500 MG 24 hr tablet Take 1,000 mg by mouth 2 (two) times daily.     omeprazole (PRILOSEC) 20 MG capsule Take 20 mg by mouth daily.       simvastatin (ZOCOR) 20 MG tablet Take 20 mg by mouth daily.     No current facility-administered medications for this encounter.    REVIEW OF SYSTEMS:  On review of systems, the patient reports that he is doing well overall. He denies any chest pain, shortness of breath, cough, fevers, chills, night sweats, unintended weight changes. He denies  any bowel disturbances, and denies abdominal pain, nausea or vomiting. He denies any new musculoskeletal or joint aches or pains. His IPSS was 6, indicating mild urinary symptoms. His SHIM was 5, indicating he has severe erectile dysfunction. A complete review of systems is obtained and is otherwise negative.   PHYSICAL EXAM:  Wt Readings from Last 3 Encounters:  01/26/21 204 lb (92.5 kg)  01/06/21 210 lb (95.3 kg)  11/18/20 213 lb (96.6 kg)   Temp Readings from Last 3 Encounters:  01/26/21 97.8 F (36.6 C) (Oral)  12/09/20 98.1 F (36.7 C) (Oral)  11/18/20 98 F (36.7 C)   BP Readings from Last 3 Encounters:  01/26/21 (!) 167/93  01/06/21 (!) 168/90  12/09/20 (!) 153/81   Pulse Readings from Last 3 Encounters:  01/26/21 87  01/06/21 86  12/09/20 80   Pain Assessment Pain Score: 0-No pain/10  In general this is a well appearing Caucasian male in no acute distress. He's alert and oriented x4 and appropriate throughout the examination. Cardiopulmonary assessment is negative for acute distress, and he exhibits normal effort.     KPS = 100  100 - Normal; no complaints; no evidence of disease. 90   - Able to carry on normal activity; minor signs or symptoms of disease. 80   - Normal activity with effort; some signs or symptoms of disease. 38   - Cares for self; unable to carry on normal activity or to do active work. 60   - Requires occasional assistance, but is able to care for most of his personal needs. 50   - Requires considerable assistance and frequent medical care. 2   - Disabled; requires special care and assistance. 10   - Severely disabled; hospital admission is indicated although death not imminent. 109   - Very sick; hospital admission necessary; active supportive treatment necessary. 10   - Moribund; fatal processes progressing rapidly. 0     - Dead  Karnofsky DA, Abelmann [place], Craver LS and Burchenal [facility] [phone]) The use of the nitrogen mustards in the palliative  treatment of carcinoma: with particular reference to bronchogenic carcinoma Cancer 1 634-56  LABORATORY DATA:  Lab Results  Component Value Date   WBC 4.0 05/03/2011   HGB 12.8 (L) 05/03/2011   HCT 36.9 (L) 05/03/2011   MCV 78.2 05/03/2011   PLT 202 05/03/2011   Lab Results  Component Value Date   NA 140 05/04/2011   K 3.7 05/04/2011   CL 109 05/04/2011   CO2 20 05/04/2011   Lab Results  Component Value Date   ALT 23 05/01/2011   AST 29 05/01/2011   ALKPHOS 79 05/01/2011   BILITOT 0.8 05/01/2011     RADIOGRAPHY:  No results found.    IMPRESSION/PLAN: 1. 71 y.o. gentleman with Stage T1c adenocarcinoma of the prostate with Gleason Score of 3+4, and PSA of 6.1. We discussed the patient's workup and outlined the nature of prostate cancer in this setting. The patient's T stage, Gleason's score, and PSA put him into the favorable intermediate risk group. Accordingly, he is eligible for a variety of potential treatment options including brachytherapy, 5.5 weeks of external radiation, or prostatectomy. We discussed the available radiation techniques, and focused on the details and logistics of delivery. We discussed and outlined the risks, benefits, short and long-term effects associated with radiotherapy and compared and contrasted these with prostatectomy. We discussed the role of SpaceOAR gel in reducing the rectal toxicity associated with radiotherapy. He appears to have a good understanding of his disease and our treatment recommendations which are of curative intent.  He was encouraged to ask questions that were answered to his stated satisfaction.  At the conclusion of our conversation, the patient is interested in moving forward with brachytherapy and use of SpaceOAR gel to reduce rectal toxicity from radiotherapy.  We will share our discussion with Dr. Felipa Eth and move forward with scheduling his [facility] planning appointment in the near future.  The patient met briefly with Romie Jumper in our office who will be working closely with him to coordinate OR scheduling and pre and post procedure appointments.  We will contact the pharmaceutical rep to ensure that [place] is available at the time of procedure.  We enjoyed meeting him today and look forward to continuing to participate in his care. . Since Dr. Felipa Eth is not operating at [facility], we'll coordinate a visit with Dr. Alyson Ingles to perform the procedure.  At the end of our visit, the patient spent time asking about my religious beliefs and then saying a prayer for me and my family.  His faith is very important to him and his family.  We personally spent 60 minutes in this encounter including chart review, reviewing radiological studies, meeting face-to-face with the patient, entering orders and completing documentation.    Nicholos Johns, PA-C    Tyler Pita, MD  [facility] Direct Dial: [phone]   Fax: [phone] [email or web address]   Skype   LinkedIn

## 2021-02-03 ENCOUNTER — Telehealth: Payer: Self-pay | Admitting: *Deleted

## 2021-02-03 ENCOUNTER — Ambulatory Visit: Payer: 59 | Admitting: Radiation Oncology

## 2021-02-03 ENCOUNTER — Ambulatory Visit: Payer: Self-pay | Admitting: Urology

## 2021-02-03 NOTE — Telephone Encounter (Signed)
CALLED PATIENT TO REMIND OF PRE-SEED APPTS. FOR 02-04-21, LVM FOR A RETURN CALL

## 2021-02-04 ENCOUNTER — Ambulatory Visit
Admission: RE | Admit: 2021-02-04 | Discharge: 2021-02-04 | Disposition: A | Discharge status: Home / Self Care | Payer: 59 | Source: Ambulatory Visit | Attending: Urology | Admitting: Urology

## 2021-02-04 ENCOUNTER — Telehealth: Payer: Self-pay | Admitting: *Deleted

## 2021-02-04 ENCOUNTER — Other Ambulatory Visit: Payer: Self-pay

## 2021-02-04 ENCOUNTER — Ambulatory Visit
Admission: RE | Admit: 2021-02-04 | Discharge: 2021-02-04 | Disposition: A | Discharge status: Home / Self Care | Payer: Medicare Other | Source: Ambulatory Visit | Attending: Radiation Oncology | Admitting: Radiation Oncology

## 2021-02-04 ENCOUNTER — Encounter: Payer: Self-pay | Admitting: Urology

## 2021-02-04 VITALS — Resp 20 | Ht 70.0 in | Wt 200.0 lb

## 2021-02-04 DIAGNOSIS — C61 Malignant neoplasm of prostate: Secondary | ICD-10-CM

## 2021-02-04 NOTE — Telephone Encounter (Signed)
Called patient to update, lvm for return call

## 2021-02-04 NOTE — Progress Notes (Signed)
°  Radiation Oncology         [phone] ________________________________  Name: Willie Oneal MRN: [id number]  Date: 02/04/2021  DOB: 07-Jan-1950  SIMULATION AND TREATMENT PLANNING NOTE PUBIC ARCH STUDY  YO:KHTX, Edwinna Areola, MD  Primus Bravo., *  DIAGNOSIS:   71 y.o. gentleman with Stage T1c adenocarcinoma of the prostate with Gleason score of 3+4, and PSA of 6.1.  Oncology History  Prostate cancer ([place]); T1cNxMx; Gleason grade group 1 & 2; favorable intermediate risk  12/09/2020 Cancer Staging   Staging form: Prostate, AJCC 8th Edition - Clinical stage from 12/09/2020: Stage IIB (cT1c, cN0, cM0, PSA: 6.1, Grade Group: 2) - Signed by Freeman Caldron, PA-C on 01/26/2021 Histopathologic type: Adenocarcinoma, NOS Stage prefix: Initial diagnosis Prostate specific antigen (PSA) range: Less than 10 Gleason primary pattern: 3 Gleason secondary pattern: 4 Gleason score: 7 Histologic grading system: 5 grade system Number of biopsy cores examined: 12 Number of biopsy cores positive: 4 Location of positive needle core biopsies: Both sides    12/14/2020 Initial Diagnosis   Prostate cancer ([place]); T1cNxMx; Gleason grade group 1 & 2; favorable intermediate risk       ICD-10-CM   1. Prostate cancer ([place]); T1cNxMx; Gleason grade group 1 & 2; favorable intermediate risk  C61       COMPLEX SIMULATION:  The patient presented today for evaluation for possible prostate seed implant. He was brought to the radiation planning suite and placed supine on the CT couch. A 3-dimensional image study set was obtained in upload to the planning computer. There, on each axial slice, I contoured the prostate gland. Then, using three-dimensional radiation planning tools I reconstructed the prostate in view of the structures from the transperineal needle pathway to assess for possible pubic arch interference. In doing so, I did not appreciate any pubic arch interference. Also, the patient's prostate volume was  estimated based on the drawn structure. The volume was 48 cc.    He does have some limited pubic arch interference due to the width of the prostate with a narrow arch, but, the bone overlap is 5 mm or less which should permit needle placement.  Given the pubic arch appearance and prostate volume, patient remains a good candidate to proceed with prostate seed implant. Today, he freely provided informed written consent to proceed.    PLAN: The patient will undergo prostate seed implant.   ________________________________  Sheral Apley. Tammi Klippel, M.D.

## 2021-02-04 NOTE — Progress Notes (Signed)
Spoke w/ patient, verified identity, and begin nursing interview w/ spouse "Bradan Congrove" in attendance. Patient states "Doing well." No symptoms reported at this time.  Meaningful use complete. Urology appointment- February 2023 No current urinary management medications at this time.   Resp 20    Ht 5\' 10"  (1.778 m)    Wt 200 lb (90.7 kg)    BMI 28.70 kg/m

## 2021-02-07 ENCOUNTER — Telehealth: Payer: Self-pay | Admitting: *Deleted

## 2021-02-07 NOTE — Telephone Encounter (Signed)
RETURNED PATIENT'S PHONE CALL, SPOKE WITH PATIENT. ?

## 2021-02-09 ENCOUNTER — Other Ambulatory Visit: Payer: Self-pay | Admitting: Urology

## 2021-02-09 DIAGNOSIS — E1165 Type 2 diabetes mellitus with hyperglycemia: Secondary | ICD-10-CM | POA: Diagnosis not present

## 2021-02-09 DIAGNOSIS — C61 Malignant neoplasm of prostate: Secondary | ICD-10-CM

## 2021-02-09 DIAGNOSIS — E782 Mixed hyperlipidemia: Secondary | ICD-10-CM | POA: Diagnosis not present

## 2021-02-09 NOTE — Progress Notes (Signed)
Surgical Physician Order Form [facility]  * Scheduling expectation : Next Available  *Length of Case: 90 minutes  *MD Preforming Case: [name], MD  *Assistant Needed: no  *Facility Preference: [facility]  *Clearance needed: no  *Anticoagulation Instructions: Hold all anticoagulants  *Aspirin Instructions: Ok to continue Aspirin  -Admit type: OUTpatient  -Anesthesia: General  -Use Standing Orders: Brachytherapy with OAR  *Diagnosis:  Prostate Cancer  *Procedure:  Cystoscopy, brachytherapy with SpaceOAR  Additional orders: N/A  -Equipment: C-Arm -VTE Prophylaxis Standing Order SCDs       Other:   -Standing Lab Orders Per Anesthesia    Lab other: None  -Standing Test orders EKG/Chest x-ray per Anesthesia       Test other:   - Medications:  Ancef 2gm IV  -Other orders:  Fleets enema AM  *Post-op visit Date/Instructions:   2 weeks

## 2021-02-15 ENCOUNTER — Telehealth: Payer: Self-pay

## 2021-02-15 NOTE — Telephone Encounter (Signed)
I spoke with Willie Oneal. We have discussed possible surgery dates and Thursday March 16th, 2023 was agreed upon by all parties. Patient given information about surgery date, what to expect pre-operatively and post operatively.  We discussed that a pre-op nurse will be calling to set up the pre-op visit that will take place prior to surgery. Informed patient that our office will communicate any additional care to be provided after surgery.  Patients questions or concerns were discussed during our call. Advised to call our office should there be any additional information, questions or concerns that arise. Patient verbalized understanding.

## 2021-02-15 NOTE — Progress Notes (Signed)
[facility] Surgery Posting Form   Surgery Date/Time: Date: 03/17/2021  Surgeon: Dr. [name], MD  Surgery Location: Day Surgery  Inpt ( No  )   Outpt (Yes)   Obs ( No  )   Diagnosis: C61 Prostate Cancer   -CPT: [phone]  Surgery: Cystoscopy and Brachytherapy with SpaceOAR Instillation  Stop Anticoagulations: Yes, may continue ASA  Cardiac/Medical/Pulmonary Clearance needed: no  *Orders entered into EPIC  Date: 02/15/21   *Case booked in EPIC  Date: 02/15/21  *Notified pt of Surgery: Date: 02/15/21  PRE-OP UA & CX: no  *Placed into Prior Authorization Work Que Date: 02/15/21   Assistant/laser/rep:No

## 2021-02-16 ENCOUNTER — Telehealth: Payer: Self-pay

## 2021-02-16 DIAGNOSIS — I10 Essential (primary) hypertension: Secondary | ICD-10-CM | POA: Diagnosis not present

## 2021-02-16 DIAGNOSIS — R197 Diarrhea, unspecified: Secondary | ICD-10-CM | POA: Diagnosis not present

## 2021-02-16 DIAGNOSIS — K219 Gastro-esophageal reflux disease without esophagitis: Secondary | ICD-10-CM | POA: Diagnosis not present

## 2021-02-16 DIAGNOSIS — R809 Proteinuria, unspecified: Secondary | ICD-10-CM | POA: Diagnosis not present

## 2021-02-16 DIAGNOSIS — M109 Gout, unspecified: Secondary | ICD-10-CM | POA: Diagnosis not present

## 2021-02-16 DIAGNOSIS — R945 Abnormal results of liver function studies: Secondary | ICD-10-CM | POA: Diagnosis not present

## 2021-02-16 DIAGNOSIS — E1165 Type 2 diabetes mellitus with hyperglycemia: Secondary | ICD-10-CM | POA: Diagnosis not present

## 2021-02-16 DIAGNOSIS — E782 Mixed hyperlipidemia: Secondary | ICD-10-CM | POA: Diagnosis not present

## 2021-02-16 NOTE — Telephone Encounter (Signed)
Left message for patient -  Patient will have Brachytherapy with Space OAR with Dr. Alyson Ingles at [facility]. Patient will need pre op appt.

## 2021-02-17 ENCOUNTER — Telehealth: Payer: Self-pay | Admitting: *Deleted

## 2021-02-17 NOTE — Telephone Encounter (Signed)
CALLED PATIENT TO INFORM THAT EKG WOULD BE DONE TOMORROW - 02-18-21  @1  PM, PATIENT TO REPORT TO [facility] MAIN ENTRANCE AND GO TO ADMITTING, SPOKE WITH PATIENT AND HE IS AWARE OF THIS APPT.

## 2021-02-17 NOTE — Telephone Encounter (Signed)
CALLED PATIENT TO INFORM OF IMPLANT DATE, SPOKE WITH PATIENT AND HE IS AWARE OF THIS PROCEDURE

## 2021-02-17 NOTE — Telephone Encounter (Signed)
Pre op appt scheduled with patient March 3rd.

## 2021-02-18 ENCOUNTER — Other Ambulatory Visit: Payer: Self-pay

## 2021-02-18 ENCOUNTER — Encounter (HOSPITAL_COMMUNITY)
Admission: RE | Admit: 2021-02-18 | Discharge: 2021-02-18 | Disposition: A | Discharge status: Home / Self Care | Payer: Medicare Other | Source: Ambulatory Visit | Attending: Urology | Admitting: Urology

## 2021-02-18 DIAGNOSIS — Z01810 Encounter for preprocedural cardiovascular examination: Secondary | ICD-10-CM | POA: Diagnosis not present

## 2021-03-04 ENCOUNTER — Encounter: Payer: Self-pay | Admitting: Urology

## 2021-03-04 ENCOUNTER — Ambulatory Visit (INDEPENDENT_AMBULATORY_CARE_PROVIDER_SITE_OTHER): Payer: Medicare Other | Admitting: Urology

## 2021-03-04 ENCOUNTER — Other Ambulatory Visit: Payer: Self-pay

## 2021-03-04 VITALS — BP 153/85 | HR 43 | Ht 70.0 in | Wt 200.0 lb

## 2021-03-04 DIAGNOSIS — R972 Elevated prostate specific antigen [PSA]: Secondary | ICD-10-CM | POA: Diagnosis not present

## 2021-03-04 DIAGNOSIS — C61 Malignant neoplasm of prostate: Secondary | ICD-10-CM | POA: Diagnosis not present

## 2021-03-04 LAB — URINALYSIS, ROUTINE W REFLEX MICROSCOPIC
Bilirubin, UA: NEGATIVE
Ketones, UA: NEGATIVE
Leukocytes,UA: NEGATIVE
Nitrite, UA: NEGATIVE
RBC, UA: NEGATIVE
Specific Gravity, UA: >1.03 — ABNORMAL HIGH (ref 1.005–1.030)
Urobilinogen, Ur: 0.2 mg/dL (ref 0.2–1.0)
pH, UA: 5.5 (ref 5.0–7.5)

## 2021-03-04 LAB — MICROSCOPIC EXAMINATION
Bacteria, UA: NONE SEEN
RBC, Urine: NONE SEEN /hpf (ref 0–2)
Renal Epithel, UA: NONE SEEN /hpf
WBC, UA: NONE SEEN /hpf (ref 0–5)

## 2021-03-04 NOTE — H&P (View-Only) (Signed)
? ?03/04/2021 ?9:35 AM  ? ?Willie Oneal. ?Jun 01, 1950 ?[id number] ? ?Referring provider: Celene Squibb, MD ?[address] ?[address] ?[place],  [place] [address] ? ?Followup prostate cancer ? ? ?HPI: ?Willie Oneal is a 72yo here for followup for prostate cancer. He met with Dr. Tammi Klippel and has elected to proceed with Brachytherapy. He has mild LUTS on no BPH therapy. IPSS 6 QOL 2.  ? ? ?PMH: ?Past Medical History:  ?Diagnosis Date  ? Diabetes mellitus   ? GERD (gastroesophageal reflux disease)   ? HTN (hypertension)   ? Hyperlipidemia   ? ? ?Surgical History: ?Past Surgical History:  ?Procedure Laterality Date  ? COLONOSCOPY N/A 07/21/2015  ? Procedure: COLONOSCOPY;  Surgeon: Rogene Houston, MD;  [facility];  Service: Endoscopy;  Laterality: N/A;  730  ? SHOULDER SURGERY    ? TONSILLECTOMY    ? ? ?Home Medications:  ?Allergies as of 03/04/2021   ?No Known Allergies ?  ? ?  ?Medication List  ?  ? ?  ? Accurate as of March 04, 2021  9:35 AM. If you have any questions, ask your nurse or doctor.  ?  ?  ? ?  ? ?allopurinol 100 MG tablet ?Commonly known as: ZYLOPRIM ?Take 100 mg by mouth daily. ?  ?atenolol 50 MG tablet ?Commonly known as: TENORMIN ?Take 50 mg by mouth daily. ?  ?Farxiga 10 MG Tabs tablet ?Generic drug: dapagliflozin propanediol ?Take 10 mg by mouth daily. ?  ?glipiZIDE 10 MG 24 hr tablet ?Commonly known as: GLUCOTROL XL ?Take 10 mg by mouth daily with breakfast. ?  ?linaGLIPtin-metFORMIN HCl 2.05-998 MG Tabs ?Take 1 tablet by mouth daily. ?  ?lisinopril 10 MG tablet ?Commonly known as: ZESTRIL ?Take 10 mg by mouth daily. ?  ?metFORMIN 500 MG 24 hr tablet ?Commonly known as: GLUCOPHAGE-XR ?Take 1,000 mg by mouth 2 (two) times daily. ?  ?omeprazole 20 MG capsule ?Commonly known as: PRILOSEC ?Take 20 mg by mouth daily. ?  ?simvastatin 20 MG tablet ?Commonly known as: ZOCOR ?Take 20 mg by mouth daily. ?  ? ?  ? ? ?Allergies: No Known Allergies ? ?Family History: ?No family history on file. ? ?Social History:   reports that he has quit smoking. He has never used smokeless tobacco. He reports that he does not drink alcohol and does not use drugs. ? ?ROS: ?All other review of systems were reviewed and are negative except what is noted above in HPI ? ?Physical Exam: ?BP (!) 153/85   Pulse (!) 43   Ht 5' 10" (1.778 m)   Wt 200 lb (90.7 kg)   BMI 28.70 kg/m?   ?Constitutional:  Alert and oriented, No acute distress. ?HEENT: [place] AT, moist mucus membranes.  Trachea midline, no masses. ?Cardiovascular: No clubbing, cyanosis, or edema. ?Respiratory: Normal respiratory effort, no increased work of breathing. ?GI: Abdomen is soft, nontender, nondistended, no abdominal masses ?GU: No CVA tenderness.  ?Lymph: No cervical or inguinal lymphadenopathy. ?Skin: No rashes, bruises or suspicious lesions. ?Neurologic: Grossly intact, no focal deficits, moving all 4 extremities. ?Psychiatric: Normal mood and affect. ? ?Laboratory Data: ?Lab Results  ?Component Value Date  ? WBC 4.0 05/03/2011  ? HGB 12.8 (L) 05/03/2011  ? HCT 36.9 (L) 05/03/2011  ? MCV 78.2 05/03/2011  ? PLT 202 05/03/2011  ? ? ?Lab Results  ?Component Value Date  ? CREATININE 1.05 05/04/2011  ? ? ?No results found for: PSA ? ?No results found for: TESTOSTERONE ? ?  No results found for: HGBA1C ? ?Urinalysis ?   ?Component Value Date/Time  ? COLORURINE YELLOW 05/02/2011 0634  ? APPEARANCEUR Clear 01/06/2021 1512  ? LABSPEC 1.025 05/02/2011 0634  ? PHURINE 5.5 05/02/2011 0634  ? GLUCOSEU 3+ (A) 01/06/2021 1512  ? HGBUR NEGATIVE 05/02/2011 0634  ? BILIRUBINUR Negative 01/06/2021 1512  ? KETONESUR NEGATIVE 05/02/2011 0634  ? PROTEINUR 3+ (A) 01/06/2021 1512  ? PROTEINUR NEGATIVE 05/02/2011 0634  ? UROBILINOGEN 0.2 05/02/2011 0634  ? NITRITE Negative 01/06/2021 1512  ? NITRITE NEGATIVE 05/02/2011 0634  ? LEUKOCYTESUR Negative 01/06/2021 1512  ? ? ?Lab Results  ?Component Value Date  ? LABMICR See below: 01/06/2021  ? WBCUA None seen 01/06/2021  ? LABEPIT None seen 01/06/2021  ?  MUCUS Present 01/06/2021  ? BACTERIA None seen 01/06/2021  ? ? ?Pertinent Imaging: ? ?No results found for this or any previous visit. ? ?No results found for this or any previous visit. ? ?No results found for this or any previous visit. ? ?No results found for this or any previous visit. ? ?No results found for this or any previous visit. ? ?No results found for this or any previous visit. ? ?No results found for this or any previous visit. ? ?No results found for this or any previous visit. ? ? ?Assessment & Plan:   ? ?1. Prostate cancer (HCC) ?I discussed the natural history of favorable intermediate risk prostate cancer with the patient and the various treatment options including active surveillance, RALP, IMRT, brachytherapy, cryotherapy, HIFU and ADT. After discussing the options the patient elects for Brachytherapy with SpaceOAR. Risks/benefits/alteranitives discussed  ?- Urinalysis, Routine w reflex microscopic ? ? ? ?No follow-ups on file. ? ?Patrick McKenzie, MD ? ?[facility] ?  ?

## 2021-03-04 NOTE — Progress Notes (Signed)
? ?03/04/2021 ?9:35 AM  ? ?Willie Oneal. ?Jun 01, 1950 ?[id number] ? ?Referring provider: Celene Squibb, MD ?[address] ?[address] ?[place],  [place] [address] ? ?Followup prostate cancer ? ? ?HPI: ?Willie Oneal is a 72yo here for followup for prostate cancer. He met with Dr. Tammi Klippel and has elected to proceed with Brachytherapy. He has mild LUTS on no BPH therapy. IPSS 6 QOL 2.  ? ? ?PMH: ?Past Medical History:  ?Diagnosis Date  ? Diabetes mellitus   ? GERD (gastroesophageal reflux disease)   ? HTN (hypertension)   ? Hyperlipidemia   ? ? ?Surgical History: ?Past Surgical History:  ?Procedure Laterality Date  ? COLONOSCOPY N/A 07/21/2015  ? Procedure: COLONOSCOPY;  Surgeon: Rogene Houston, MD;  [facility];  Service: Endoscopy;  Laterality: N/A;  730  ? SHOULDER SURGERY    ? TONSILLECTOMY    ? ? ?Home Medications:  ?Allergies as of 03/04/2021   ?No Known Allergies ?  ? ?  ?Medication List  ?  ? ?  ? Accurate as of March 04, 2021  9:35 AM. If you have any questions, ask your nurse or doctor.  ?  ?  ? ?  ? ?allopurinol 100 MG tablet ?Commonly known as: ZYLOPRIM ?Take 100 mg by mouth daily. ?  ?atenolol 50 MG tablet ?Commonly known as: TENORMIN ?Take 50 mg by mouth daily. ?  ?Farxiga 10 MG Tabs tablet ?Generic drug: dapagliflozin propanediol ?Take 10 mg by mouth daily. ?  ?glipiZIDE 10 MG 24 hr tablet ?Commonly known as: GLUCOTROL XL ?Take 10 mg by mouth daily with breakfast. ?  ?linaGLIPtin-metFORMIN HCl 2.05-998 MG Tabs ?Take 1 tablet by mouth daily. ?  ?lisinopril 10 MG tablet ?Commonly known as: ZESTRIL ?Take 10 mg by mouth daily. ?  ?metFORMIN 500 MG 24 hr tablet ?Commonly known as: GLUCOPHAGE-XR ?Take 1,000 mg by mouth 2 (two) times daily. ?  ?omeprazole 20 MG capsule ?Commonly known as: PRILOSEC ?Take 20 mg by mouth daily. ?  ?simvastatin 20 MG tablet ?Commonly known as: ZOCOR ?Take 20 mg by mouth daily. ?  ? ?  ? ? ?Allergies: No Known Allergies ? ?Family History: ?No family history on file. ? ?Social History:   reports that he has quit smoking. He has never used smokeless tobacco. He reports that he does not drink alcohol and does not use drugs. ? ?ROS: ?All other review of systems were reviewed and are negative except what is noted above in HPI ? ?Physical Exam: ?BP (!) 153/85   Pulse (!) 43   Ht 5' 10" (1.778 m)   Wt 200 lb (90.7 kg)   BMI 28.70 kg/m?   ?Constitutional:  Alert and oriented, No acute distress. ?HEENT: [place] AT, moist mucus membranes.  Trachea midline, no masses. ?Cardiovascular: No clubbing, cyanosis, or edema. ?Respiratory: Normal respiratory effort, no increased work of breathing. ?GI: Abdomen is soft, nontender, nondistended, no abdominal masses ?GU: No CVA tenderness.  ?Lymph: No cervical or inguinal lymphadenopathy. ?Skin: No rashes, bruises or suspicious lesions. ?Neurologic: Grossly intact, no focal deficits, moving all 4 extremities. ?Psychiatric: Normal mood and affect. ? ?Laboratory Data: ?Lab Results  ?Component Value Date  ? WBC 4.0 05/03/2011  ? HGB 12.8 (L) 05/03/2011  ? HCT 36.9 (L) 05/03/2011  ? MCV 78.2 05/03/2011  ? PLT 202 05/03/2011  ? ? ?Lab Results  ?Component Value Date  ? CREATININE 1.05 05/04/2011  ? ? ?No results found for: PSA ? ?No results found for: TESTOSTERONE ? ?  No results found for: HGBA1C ? ?Urinalysis ?   ?Component Value Date/Time  ? COLORURINE YELLOW 05/02/2011 0634  ? APPEARANCEUR Clear 01/06/2021 1512  ? LABSPEC 1.025 05/02/2011 0634  ? PHURINE 5.5 05/02/2011 0634  ? GLUCOSEU 3+ (A) 01/06/2021 1512  ? HGBUR NEGATIVE 05/02/2011 0634  ? BILIRUBINUR Negative 01/06/2021 1512  ? KETONESUR NEGATIVE 05/02/2011 0634  ? PROTEINUR 3+ (A) 01/06/2021 1512  ? PROTEINUR NEGATIVE 05/02/2011 0634  ? UROBILINOGEN 0.2 05/02/2011 0634  ? NITRITE Negative 01/06/2021 1512  ? NITRITE NEGATIVE 05/02/2011 0634  ? LEUKOCYTESUR Negative 01/06/2021 1512  ? ? ?Lab Results  ?Component Value Date  ? LABMICR See below: 01/06/2021  ? WBCUA None seen 01/06/2021  ? LABEPIT None seen 01/06/2021  ?  MUCUS Present 01/06/2021  ? BACTERIA None seen 01/06/2021  ? ? ?Pertinent Imaging: ? ?No results found for this or any previous visit. ? ?No results found for this or any previous visit. ? ?No results found for this or any previous visit. ? ?No results found for this or any previous visit. ? ?No results found for this or any previous visit. ? ?No results found for this or any previous visit. ? ?No results found for this or any previous visit. ? ?No results found for this or any previous visit. ? ? ?Assessment & Plan:   ? ?1. Prostate cancer (HCC) ?I discussed the natural history of favorable intermediate risk prostate cancer with the patient and the various treatment options including active surveillance, RALP, IMRT, brachytherapy, cryotherapy, HIFU and ADT. After discussing the options the patient elects for Brachytherapy with SpaceOAR. Risks/benefits/alteranitives discussed  ?- Urinalysis, Routine w reflex microscopic ? ? ? ?No follow-ups on file. ? ?Takiyah Bohnsack, MD ? ?[facility] ?  ?

## 2021-03-04 NOTE — Patient Instructions (Signed)
Brachytherapy for Prostate Cancer ?Brachytherapy for prostate cancer is a type of internal radiation treatment that involves placing a source of radiation right inside the prostate gland. This allows the delivery of a higher dose of radiation than would be possible with external radiation therapy treatment. There are many types of brachytherapy: ?Low-dose rate (LDR) therapy. This involves temporary or permanent implants of radioactive seeds or pellets that give off a low dose of radiation. ?Temporary low-dose implants are left in the prostate for 1-7 days. The radioactive material is contained within a delivery tool, which may be a needle, a small, thin tube (catheter), or another type of applicator. You will need to stay in the hospital while the delivery tool and radioactive material are in place. ?Permanent low-dose implants are left in the prostate. They slowly give off radiation for many months after they are inserted. After the radiation is gone, they just stay in the body. They do not cause any harm and are not removed. ?High-dose rate (HDR) therapy. This involves inserting a material that gives off a higher dose of radiation for only a few minutes. The radioactive material is often wires or ribbons contained within a delivery tool (needle, applicator, or catheter). The delivery tool is removed after treatment, and no radioactive material is left in the prostate. ?In brachytherapy, the radiation does not travel far from the prostate, so healthy tissues around the prostate receive only a small dose of radiation. This helps to protect those tissues from injury. In some cases, brachytherapy may be given along with external beam radiation. ?Tell a health care provider about: ?Any allergies you have. ?All medicines you are taking, including vitamins, herbs, eye drops, creams, and over-the-counter medicines. ?Any problems you or family members have had with anesthetic medicines. ?Any bleeding problems you  have. ?Any surgeries you have had. ?Any medical conditions you have. ?Any prostate infections you have had. ?What are the risks? ?Generally, this is a safe procedure. However, problems may occur, including: ?Inflammation of the rectum. ?Problems getting or keeping an erection (erectile dysfunction). ?Inability to control when you urinate or have bowel movements (incontinence). ?Damage to nearby structures or organs. ?Diarrhea. ?Bleeding. ?What happens before the procedure? ?Staying hydrated ?Follow instructions from your health care provider about hydration, which may include: ?Up to 2 hours before the procedure - you may continue to drink clear liquids, such as water, clear fruit juice, black coffee, and plain tea. ? ?Eating and drinking restrictions ?Follow instructions from your health care provider about eating and drinking, which may include: ?8 hours before the procedure - stop eating heavy meals or foods, such as meat, fried foods, or fatty foods. ?6 hours before the procedure - stop eating light meals or foods, such as toast or cereal. ?6 hours before the procedure - stop drinking milk or drinks that contain milk. ?2 hours before the procedure - stop drinking clear liquids. ?Medicines ?Ask your health care provider about: ?Changing or stopping your regular medicines. This is especially important if you are taking diabetes medicines or blood thinners. ?Taking medicines such as aspirin and ibuprofen. These medicines can thin your blood. Do not take these medicines unless your health care provider tells you to take them. ?Taking over-the-counter medicines, vitamins, herbs, and supplements. ?Follow your health care provider's instructions about cleaning out your bowels. ?Surgery safety ?Ask your health care provider: ?How your surgery site will be marked. ?What steps will be taken to help prevent infection. These may include: ?Removing hair at the procedure site. ?  Washing skin with a germ-killing soap. ?Taking  antibiotic medicine before and after the procedure. ?General instructions ?You may have exams or testing done before or after the procedure. Blood or urine samples may be taken. You may need imaging tests, such as a CT scan or an MRI. ?Do not use any products that contain nicotine or tobacco for at least 4 weeks before the procedure. This includes cigarettes, chewing tobacco, and vaping devices, such as e-cigarettes. If you need help quitting, ask your health care provider. ?Plan to have a responsible adult take you home from the hospital or clinic. ?Plan to have a responsible adult care for you for the time you are told after you leave the hospital or clinic. ?What happens during the procedure? ?An IV will be put into one of the veins in your arm or hand. ?You may be given: ?A medicine to help you relax (sedative). ?A medicine to numb the area (local anesthetic). ?A medicine to make you fall asleep (general anesthetic). ?A thin, flexible tube (Foley catheter) might be put into your penis, through your urethra, and into your bladder to drain your urine. ?Your surgeon will insert the radioactive material. The method used will depend on whether you are receiving temporary or permanent brachytherapy. ?Temporary low-dose or high-dose brachytherapy ?A delivery tool (needle, applicator, or catheter) will be put into the prostate. It will be inserted through a body cavity, like the rectum, or through the perineum, which is the area beneath the scrotum. ?An X-ray, ultrasound, MRI, or CT scan will be used to guide the delivery tool into the prostate. ?Radioactive seeds, pellets, wires, or ribbons will be fed through the delivery tool. ?If the high-dose method is used: ?The radioactive material will be left in for a few minutes and then removed. ?When the treatment is finished, the delivery tool will be removed. ?If the low-dose method is used: ?The delivery tool containing the radioactive material will stay in place for 1-7  days. ?You will stay in the hospital while the implant is in place. ?When the treatment is finished, the radioactive material and delivery tool will be removed. ?Permanent low-dose brachytherapy ? ?A tube or needle will be used to inject small, radioactive seeds or pellets into your prostate. ?The needle or tube will be removed, leaving the seeds or pellets in the prostate. ?The procedure may vary among health care providers and hospitals. ?What happens after the procedure? ?Your blood pressure, heart rate, breathing rate, and blood oxygen level will be monitored until you leave the hospital or clinic. ?If you were given a sedative during the procedure, it can affect you for several hours. Do not drive or operate machinery until your health care provider says that it is safe. ?If radiation seeds were left in your prostate, be sure you understand any safety precautions you are to follow at home. ?Summary ?Brachytherapy for prostate cancer is a type of radiation treatment that involves placing a source of radiation right inside the prostate gland. ?There are several types of brachytherapy for prostate cancer: low-dose temporary treatment, low-dose permanent treatment, and high-dose temporary treatment. ?The amount of time that the source of radiation is left in your prostate will depend on the type of brachytherapy you are having. ?This information is not intended to replace advice given to you by your health care provider. Make sure you discuss any questions you have with your health care provider. ?Document Revised: 03/17/2020 Document Reviewed: 03/17/2020 ?Elsevier Patient Education ? [place]. ? ?

## 2021-03-14 ENCOUNTER — Encounter (HOSPITAL_BASED_OUTPATIENT_CLINIC_OR_DEPARTMENT_OTHER): Payer: Self-pay | Admitting: Urology

## 2021-03-14 NOTE — Progress Notes (Signed)
Spoke with dr Thera Flake via secure chat, patient needs cbc and cmet for 03-17-2021 surgery. Per dr Mamie Nick Alyson Ingles cbc and cmet may be done day of surgery, pt does not need a lab appointment prior to 03-17-2021 surgery. ?

## 2021-03-15 ENCOUNTER — Encounter (HOSPITAL_COMMUNITY)
Admission: RE | Admit: 2021-03-15 | Discharge: 2021-03-15 | Disposition: A | Discharge status: Home / Self Care | Payer: Medicare Other | Source: Ambulatory Visit | Attending: Urology | Admitting: Urology

## 2021-03-15 ENCOUNTER — Other Ambulatory Visit: Payer: Self-pay

## 2021-03-15 ENCOUNTER — Encounter (HOSPITAL_BASED_OUTPATIENT_CLINIC_OR_DEPARTMENT_OTHER): Payer: Self-pay | Admitting: Urology

## 2021-03-15 DIAGNOSIS — Z01812 Encounter for preprocedural laboratory examination: Secondary | ICD-10-CM | POA: Insufficient documentation

## 2021-03-15 DIAGNOSIS — C61 Malignant neoplasm of prostate: Secondary | ICD-10-CM | POA: Insufficient documentation

## 2021-03-15 LAB — COMPREHENSIVE METABOLIC PANEL
ALT: 15 U/L (ref 0–44)
AST: 16 U/L (ref 15–41)
Albumin: 4.7 g/dL (ref 3.5–5.0)
Alkaline Phosphatase: 58 U/L (ref 38–126)
Anion gap: 11 (ref 5–15)
BUN: 26 mg/dL — ABNORMAL HIGH (ref 8–23)
CO2: 23 mmol/L (ref 22–32)
Calcium: 9.6 mg/dL (ref 8.9–10.3)
Chloride: 107 mmol/L (ref 98–111)
Creatinine, Ser: 1.23 mg/dL (ref 0.61–1.24)
GFR, Estimated: >60 mL/min (ref >60)
Glucose, Bld: 147 mg/dL — ABNORMAL HIGH (ref 70–99)
Potassium: 4.6 mmol/L (ref 3.5–5.1)
Sodium: 141 mmol/L (ref 135–145)
Total Bilirubin: 1.3 mg/dL — ABNORMAL HIGH (ref 0.3–1.2)
Total Protein: 7.6 g/dL (ref 6.5–8.1)

## 2021-03-15 LAB — CBC
HCT: 50.5 % (ref 39.0–52.0)
Hemoglobin: 17.1 g/dL — ABNORMAL HIGH (ref 13.0–17.0)
MCH: 29.3 pg (ref 26.0–34.0)
MCHC: 33.9 g/dL (ref 30.0–36.0)
MCV: 86.6 fL (ref 80.0–100.0)
Platelets: 210 10*3/uL (ref 150–400)
RBC: 5.83 MIL/uL — ABNORMAL HIGH (ref 4.22–5.81)
RDW: 15.2 % (ref 11.5–15.5)
WBC: 7.8 10*3/uL (ref 4.0–10.5)
nRBC: 0 % (ref 0.0–0.2)

## 2021-03-15 NOTE — Progress Notes (Addendum)
Spoke w/ via phone for pre-op interview---pt ?Lab needs dos----    none           ?Lab results------cbc cmp both done 03-15-2021 epic, ekg 02-18-2021 chart/epicpatient states asymptomatic no test needed ?Arrive at -------1130 am 03-17-2021 ?NPO after MN NO Solid Food.  Clear liquids from MN until---1030 am ?Med rec completed ?Medications to take morning of surgery ----- allopurinol, atenolol, simvastatin ?Diabetic medication -----none day of surgery ?Patient instructed no nail polish to be worn day of surgery ?Patient instructed to bring photo id and insurance card day of surgery ?Patient aware to have Driver (ride ) / caregiver   wife Marcie Bal will stay  for 24 hours after surgery  ?Patient Special Instructions -----fleets enema am of surgery ?Pre-Op special Istructions -----none ?Patient verbalized understanding of instructions that were given at this phone interview. ?Patient denies shortness of breath, chest pain, fever, cough at this phone interview.  ?

## 2021-03-16 ENCOUNTER — Telehealth: Payer: Self-pay | Admitting: *Deleted

## 2021-03-16 NOTE — Telephone Encounter (Signed)
CALLED PATIENT TO REMIND OF PROCEDURE FOR 03-17-21, SPOKE WITH PATIENT AND HE IS AWARE OF THIS PROCEDURE ?

## 2021-03-16 NOTE — Anesthesia Preprocedure Evaluation (Addendum)
Anesthesia Evaluation  ?Patient identified by MRN, date of birth, ID band ?Patient awake ? ? ? ?Reviewed: ?Allergy & Precautions, NPO status , Patient's Chart, lab work & pertinent test results ? ?Airway ?Mallampati: I ? ?TM Distance: >3 FB ?Neck ROM: Full ? ? ? Dental ?no notable dental hx. ?(+) Teeth Intact, Dental Advisory Given ?  ?Pulmonary ?former smoker,  ?  ?Pulmonary exam normal ?breath sounds clear to auscultation ? ? ? ? ? ? Cardiovascular ?hypertension, Pt. on medications ?Normal cardiovascular exam ?Rhythm:Regular Rate:Normal ? ? ?  ?Neuro/Psych ?  ? GI/Hepatic ?GERD  ,  ?Endo/Other  ?diabetes, Type 2, Oral Hypoglycemic Agents ? Renal/GU ?Renal disease  ? ?Prostate CA ? ?  ?Musculoskeletal ? ?(+) Arthritis ,  ? Abdominal ?  ?Peds ? Hematology ?  ?Anesthesia Other Findings ?All: PCN ? Reproductive/Obstetrics ? ?  ? ? ? ? ? ? ? ? ? ? ? ? ? ?  ?  ? ? ? ? ? ? ? ?Anesthesia Physical ?Anesthesia Plan ? ?ASA: 3 ? ?Anesthesia Plan: General  ? ?Post-op Pain Management:   ? ?Induction: Intravenous ? ?PONV Risk Score and Plan: 3 and Treatment may vary due to age or medical condition, Midazolam, Dexamethasone and Ondansetron ? ?Airway Management Planned: Oral ETT ? ?Additional Equipment: None ? ?Intra-op Plan:  ? ?Post-operative Plan: Extubation in OR ? ?Informed Consent: I have reviewed the patients History and Physical, chart, labs and discussed the procedure including the risks, benefits and alternatives for the proposed anesthesia with the patient or authorized representative who has indicated his/her understanding and acceptance.  ? ? ? ?Dental advisory given ? ?Plan Discussed with: CRNA ? ?Anesthesia Plan Comments:   ? ? ? ? ? ?Anesthesia Quick Evaluation ? ?

## 2021-03-17 ENCOUNTER — Other Ambulatory Visit: Payer: Self-pay

## 2021-03-17 ENCOUNTER — Encounter (HOSPITAL_BASED_OUTPATIENT_CLINIC_OR_DEPARTMENT_OTHER): Payer: Self-pay | Admitting: Urology

## 2021-03-17 ENCOUNTER — Ambulatory Visit (HOSPITAL_COMMUNITY): Payer: Medicare Other

## 2021-03-17 ENCOUNTER — Ambulatory Visit (HOSPITAL_BASED_OUTPATIENT_CLINIC_OR_DEPARTMENT_OTHER): Payer: Medicare Other | Admitting: Anesthesiology

## 2021-03-17 ENCOUNTER — Encounter (HOSPITAL_BASED_OUTPATIENT_CLINIC_OR_DEPARTMENT_OTHER)
Admission: RE | Disposition: A | Discharge status: Home / Self Care | Payer: Self-pay | Source: Ambulatory Visit | Attending: Urology

## 2021-03-17 ENCOUNTER — Ambulatory Visit (HOSPITAL_BASED_OUTPATIENT_CLINIC_OR_DEPARTMENT_OTHER)
Admission: RE | Admit: 2021-03-17 | Discharge: 2021-03-17 | Disposition: A | Discharge status: Home / Self Care | Payer: Medicare Other | Source: Ambulatory Visit | Attending: Urology | Admitting: Urology

## 2021-03-17 DIAGNOSIS — I10 Essential (primary) hypertension: Secondary | ICD-10-CM

## 2021-03-17 DIAGNOSIS — Z794 Long term (current) use of insulin: Secondary | ICD-10-CM | POA: Diagnosis not present

## 2021-03-17 DIAGNOSIS — C61 Malignant neoplasm of prostate: Secondary | ICD-10-CM | POA: Insufficient documentation

## 2021-03-17 DIAGNOSIS — K219 Gastro-esophageal reflux disease without esophagitis: Secondary | ICD-10-CM | POA: Diagnosis not present

## 2021-03-17 DIAGNOSIS — Z7984 Long term (current) use of oral hypoglycemic drugs: Secondary | ICD-10-CM

## 2021-03-17 DIAGNOSIS — Z87891 Personal history of nicotine dependence: Secondary | ICD-10-CM | POA: Diagnosis not present

## 2021-03-17 DIAGNOSIS — E119 Type 2 diabetes mellitus without complications: Secondary | ICD-10-CM | POA: Insufficient documentation

## 2021-03-17 HISTORY — PX: CYSTOSCOPY: SHX5120

## 2021-03-17 HISTORY — DX: Personal history of urinary calculi: Z87.442

## 2021-03-17 HISTORY — DX: Unspecified osteoarthritis, unspecified site: M19.90

## 2021-03-17 HISTORY — PX: RADIOACTIVE SEED IMPLANT: SHX5150

## 2021-03-17 HISTORY — DX: Malignant neoplasm of prostate: C61

## 2021-03-17 HISTORY — DX: Personal history of other diseases of the musculoskeletal system and connective tissue: Z87.39

## 2021-03-17 LAB — GLUCOSE, CAPILLARY
Glucose-Capillary: 154 mg/dL — ABNORMAL HIGH (ref 70–99)
Glucose-Capillary: 123 mg/dL — ABNORMAL HIGH (ref 70–99)

## 2021-03-17 SURGERY — INSERTION, RADIATION SOURCE, PROSTATE
Anesthesia: General

## 2021-03-17 MED ORDER — FLEET ENEMA 7-19 GM/118ML RE ENEM
1.0000 | ENEMA | Freq: Once | RECTAL | Status: AC
  Administered 2021-03-17: 1 via RECTAL

## 2021-03-17 MED ORDER — ONDANSETRON HCL 4 MG/2ML IJ SOLN: 4.0000 mg | Freq: Once | INTRAMUSCULAR | Status: DC | PRN

## 2021-03-17 MED ORDER — LACTATED RINGERS IV SOLN: INTRAVENOUS | Status: DC

## 2021-03-17 MED ORDER — MIDAZOLAM HCL 2 MG/2ML IJ SOLN
INTRAMUSCULAR | Status: AC
  Filled 2021-03-17: qty 2

## 2021-03-17 MED ORDER — ONDANSETRON HCL 4 MG/2ML IJ SOLN
INTRAMUSCULAR | Status: AC
  Filled 2021-03-17: qty 2

## 2021-03-17 MED ORDER — FENTANYL CITRATE (PF) 100 MCG/2ML IJ SOLN
INTRAMUSCULAR | Status: AC
Start: 2021-03-17 — End: ?
  Filled 2021-03-17: qty 2

## 2021-03-17 MED ORDER — PHENYLEPHRINE 40 MCG/ML (10ML) SYRINGE FOR IV PUSH (FOR BLOOD PRESSURE SUPPORT)
PREFILLED_SYRINGE | INTRAVENOUS | Status: DC | PRN
  Administered 2021-03-17 (×2): 80 ug via INTRAVENOUS

## 2021-03-17 MED ORDER — DEXAMETHASONE SODIUM PHOSPHATE 10 MG/ML IJ SOLN
INTRAMUSCULAR | Status: AC
  Filled 2021-03-17: qty 1

## 2021-03-17 MED ORDER — ROCURONIUM BROMIDE 10 MG/ML (PF) SYRINGE
PREFILLED_SYRINGE | INTRAVENOUS | Status: DC | PRN
  Administered 2021-03-17: 80 mg via INTRAVENOUS

## 2021-03-17 MED ORDER — PROPOFOL 10 MG/ML IV BOLUS
INTRAVENOUS | Status: AC
  Filled 2021-03-17: qty 20

## 2021-03-17 MED ORDER — ROCURONIUM BROMIDE 10 MG/ML (PF) SYRINGE
PREFILLED_SYRINGE | INTRAVENOUS | Status: AC
  Filled 2021-03-17: qty 10

## 2021-03-17 MED ORDER — SUGAMMADEX SODIUM 200 MG/2ML IV SOLN
INTRAVENOUS | Status: DC | PRN
  Administered 2021-03-17: 200 mg via INTRAVENOUS

## 2021-03-17 MED ORDER — CEFAZOLIN SODIUM-DEXTROSE 2-4 GM/100ML-% IV SOLN
2.0000 g | INTRAVENOUS | Status: AC
  Administered 2021-03-17: 2 g via INTRAVENOUS

## 2021-03-17 MED ORDER — FENTANYL CITRATE (PF) 100 MCG/2ML IJ SOLN
INTRAMUSCULAR | Status: DC | PRN
  Administered 2021-03-17 (×2): 50 ug via INTRAVENOUS

## 2021-03-17 MED ORDER — LIDOCAINE HCL (PF) 2 % IJ SOLN
INTRAMUSCULAR | Status: AC
  Filled 2021-03-17: qty 5

## 2021-03-17 MED ORDER — LIDOCAINE 2% (20 MG/ML) 5 ML SYRINGE
INTRAMUSCULAR | Status: DC | PRN
  Administered 2021-03-17: 100 mg via INTRAVENOUS

## 2021-03-17 MED ORDER — SODIUM CHLORIDE 0.9 % IV SOLN
INTRAVENOUS | Status: AC | PRN
  Administered 2021-03-17: 1000 mL

## 2021-03-17 MED ORDER — IOHEXOL 300 MG/ML  SOLN
INTRAMUSCULAR | Status: DC | PRN
  Administered 2021-03-17: 10 mL

## 2021-03-17 MED ORDER — ONDANSETRON HCL 4 MG/2ML IJ SOLN
INTRAMUSCULAR | Status: DC | PRN
  Administered 2021-03-17: 4 mg via INTRAVENOUS

## 2021-03-17 MED ORDER — PHENYLEPHRINE 40 MCG/ML (10ML) SYRINGE FOR IV PUSH (FOR BLOOD PRESSURE SUPPORT)
PREFILLED_SYRINGE | INTRAVENOUS | Status: AC
  Filled 2021-03-17: qty 10

## 2021-03-17 MED ORDER — MIDAZOLAM HCL 2 MG/2ML IJ SOLN
INTRAMUSCULAR | Status: DC | PRN
  Administered 2021-03-17: 2 mg via INTRAVENOUS

## 2021-03-17 MED ORDER — PROPOFOL 10 MG/ML IV BOLUS
INTRAVENOUS | Status: DC | PRN
  Administered 2021-03-17: 150 mg via INTRAVENOUS

## 2021-03-17 MED ORDER — CEFAZOLIN SODIUM-DEXTROSE 2-4 GM/100ML-% IV SOLN
INTRAVENOUS | Status: AC
  Filled 2021-03-17: qty 100

## 2021-03-17 MED ORDER — TRAMADOL HCL 50 MG PO TABS: 50.0000 mg | ORAL_TABLET | Freq: Four times a day (QID) | ORAL | 0 refills | Status: DC | PRN

## 2021-03-17 MED ORDER — ACETAMINOPHEN 10 MG/ML IV SOLN: 1000.0000 mg | Freq: Once | INTRAVENOUS | Status: DC | PRN

## 2021-03-17 MED ORDER — FENTANYL CITRATE (PF) 100 MCG/2ML IJ SOLN: 25.0000 ug | INTRAMUSCULAR | Status: DC | PRN

## 2021-03-17 MED ORDER — DEXAMETHASONE SODIUM PHOSPHATE 10 MG/ML IJ SOLN
INTRAMUSCULAR | Status: DC | PRN
Start: 2021-03-17 — End: 2021-03-17
  Administered 2021-03-17: 5 mg via INTRAVENOUS

## 2021-03-17 SURGICAL SUPPLY — 43 items
BAG DRN RND TRDRP ANRFLXCHMBR (UROLOGICAL SUPPLIES) ×1
BAG URINE DRAIN 2000ML AR STRL (UROLOGICAL SUPPLIES) ×3 IMPLANT
BARD BRACYTHERAPY SEEDS ×1 IMPLANT
BLADE CLIPPER SENSICLIP SURGIC (BLADE) ×3 IMPLANT
CATH FOLEY 2WAY SLVR  5CC 16FR (CATHETERS) ×4
CATH FOLEY 2WAY SLVR 5CC 16FR (CATHETERS) ×4 IMPLANT
CATH ROBINSON RED A/P 20FR (CATHETERS) ×3 IMPLANT
CLOTH BEACON ORANGE TIMEOUT ST (SAFETY) ×3 IMPLANT
CNTNR URN SCR LID CUP LEK RST (MISCELLANEOUS) ×2 IMPLANT
CONT SPEC 4OZ STRL OR WHT (MISCELLANEOUS) ×2
COVER BACK TABLE 60X90IN (DRAPES) ×3 IMPLANT
COVER MAYO STAND STRL (DRAPES) ×3 IMPLANT
DRSG TEGADERM 4X4.75 (GAUZE/BANDAGES/DRESSINGS) ×3 IMPLANT
DRSG TEGADERM 8X12 (GAUZE/BANDAGES/DRESSINGS) ×3 IMPLANT
GAUZE SPONGE 4X4 12PLY STRL LF (GAUZE/BANDAGES/DRESSINGS) ×1 IMPLANT
GEL ULTRASOUND 20GR AQUASONIC (MISCELLANEOUS) ×3 IMPLANT
GLOVE SURG ENC MOIS LTX SZ6.5 (GLOVE) ×3 IMPLANT
GLOVE SURG ENC MOIS LTX SZ7.5 (GLOVE) IMPLANT
GLOVE SURG ENC MOIS LTX SZ8 (GLOVE) ×3 IMPLANT
GLOVE SURG ORTHO LTX SZ8.5 (GLOVE) IMPLANT
GLOVE SURG UNDER POLY LF SZ6.5 (GLOVE) IMPLANT
GOWN STRL REUS W/TWL XL LVL3 (GOWN DISPOSABLE) ×3 IMPLANT
GRID BRACH TEMP 18GA 2.8X3X.75 (MISCELLANEOUS) ×3 IMPLANT
HOLDER FOLEY CATH W/STRAP (MISCELLANEOUS) ×3 IMPLANT
IV NS 1000ML (IV SOLUTION) ×2
IV NS 1000ML BAXH (IV SOLUTION) ×2 IMPLANT
KIT TURNOVER CYSTO (KITS) ×3 IMPLANT
MANIFOLD NEPTUNE II (INSTRUMENTS) IMPLANT
NDL BRACHY 18G 5PK (NEEDLE) ×8 IMPLANT
NDL BRACHY 18G SINGLE (NEEDLE) IMPLANT
NDL PK MORGANSTERN STABILIZ (NEEDLE) ×2 IMPLANT
NEEDLE BRACHY 18G 5PK (NEEDLE) ×6 IMPLANT
NEEDLE BRACHY 18G SINGLE (NEEDLE) ×10 IMPLANT
NEEDLE PK MORGANSTERN STABILIZ (NEEDLE) ×2 IMPLANT
PACK CYSTO (CUSTOM PROCEDURE TRAY) ×3 IMPLANT
SHEATH ULTRASOUND LF (SHEATH) IMPLANT
SHEATH ULTRASOUND LTX NONSTRL (SHEATH) ×1 IMPLANT
SYR 10ML LL (SYRINGE) ×6 IMPLANT
SYR CONTROL 10ML LL (SYRINGE) ×3 IMPLANT
TOWEL OR 17X26 10 PK STRL BLUE (TOWEL DISPOSABLE) ×6 IMPLANT
UNDERPAD 30X36 HEAVY ABSORB (UNDERPADS AND DIAPERS) ×6 IMPLANT
WATER STERILE IRR 3000ML UROMA (IV SOLUTION) ×3 IMPLANT
WATER STERILE IRR 500ML POUR (IV SOLUTION) ×3 IMPLANT

## 2021-03-17 NOTE — Discharge Instructions (Signed)

## 2021-03-17 NOTE — Transfer of Care (Signed)
Immediate Anesthesia Transfer of Care Note ? ?Patient: Willie Oneal. ? ?Procedure(s) Performed: RADIOACTIVE SEED IMPLANT/BRACHYTHERAPY IMPLANT ?CYSTOSCOPY ? ?Patient Location: PACU ? ?Anesthesia Type:General ? ?Level of Consciousness: awake, alert , oriented and patient cooperative ? ?Airway & Oxygen Therapy: Patient Spontanous Breathing and Patient connected to nasal cannula oxygen ? ?Post-op Assessment: Report given to RN and Post -op Vital signs reviewed and stable ? ?Post vital signs: Reviewed and stable ? ?Last Vitals:  ?Vitals Value Taken Time  ?BP 151/77 03/17/21 1634  ?Temp 36.6 ?C 03/17/21 1634  ?Pulse 84 03/17/21 1635  ?Resp 19 03/17/21 1635  ?SpO2 97 % 03/17/21 1635  ?Vitals shown include unvalidated device data. ? ?Last Pain:  ?Vitals:  ? 03/17/21 1156  ?TempSrc: Oral  ?   ? ?  ? ?Complications: No notable events documented. ?

## 2021-03-17 NOTE — Progress Notes (Signed)
?Radiation Oncology         [phone] ?________________________________ ? ?Name: Willie Oneal. MRN: [id number]  ?Date: 03/17/2021  DOB: [birth year] ? ?     Prostate Seed Implant ? ?UX:NATF, Willie Areola, MD  No ref. provider found ? ?DIAGNOSIS:   71 y.o. gentleman with Stage T1c adenocarcinoma of the prostate with Gleason score of 3+4, and PSA of 6.1. ? ?Oncology History  ?Prostate cancer ([place]); T1cNxMx; Gleason grade group 1 & 2; favorable intermediate risk  ?12/09/2020 Cancer Staging  ? Staging form: Prostate, AJCC 8th Edition ?- Clinical stage from 12/09/2020: Stage IIB (cT1c, cN0, cM0, PSA: 6.1, Grade Group: 2) - Signed by Willie Caldron, PA-C on 01/26/2021 ?Histopathologic type: Adenocarcinoma, NOS ?Stage prefix: Initial diagnosis ?Prostate specific antigen (PSA) range: Less than 10 ?Gleason primary pattern: 3 ?Gleason secondary pattern: 4 ?Gleason score: 7 ?Histologic grading system: 5 grade system ?Number of biopsy cores examined: 12 ?Number of biopsy cores positive: 4 ?Location of positive needle core biopsies: Both sides ? ?  ?12/14/2020 Initial Diagnosis  ? Prostate cancer ([place]); T1cNxMx; Gleason grade group 1 & 2; favorable intermediate risk ?  ? ? ?  ICD-10-CM   ?1. Prostate cancer ([place]); T1cNxMx; Gleason grade group 1 & 2; favorable intermediate risk  C61 ceFAZolin (ANCEF) IVPB 2g/100 mL premix  ?  sodium phosphate (FLEET) 7-19 GM/118ML enema 1 enema  ?  ? ? ?PROCEDURE: Insertion of radioactive I-125 seeds into the prostate gland. ? ?RADIATION DOSE: 145 Gy, definitive therapy. ? ?TECHNIQUE: Willie Oneal. was brought to the operating room with the urologist. He was placed in the dorsolithotomy position. He was catheterized and a rectal tube was inserted. The perineum was shaved, prepped and draped. The ultrasound probe was then introduced into the rectum to see the prostate gland. ? ?TREATMENT DEVICE: A needle grid was attached to the ultrasound probe stand and anchor needles were placed. ? ?3D  PLANNING: The prostate was imaged in 3D using a sagittal sweep of the prostate probe. These images were transferred to the planning computer. There, the prostate, urethra and rectum were defined on each axial reconstructed image. Then, the software created an optimized 3D plan and a few seed positions were adjusted. The quality of the plan was reviewed using [facility] information for the target and the following two organs at risk:  Urethra and Rectum.  Then the accepted plan was printed and handed off to the radiation therapist.  Under my supervision, the custom loading of the seeds and spacers was carried out and loaded into sealed vicryl sleeves.  These pre-loaded needles were then placed into the needle holder.. ? ?PROSTATE VOLUME STUDY:  Using transrectal ultrasound the volume of the prostate was verified to be 53.7 cc. ? ?SPECIAL TREATMENT PROCEDURE/SUPERVISION AND HANDLING: The pre-loaded needles were then delivered under sagittal guidance. A total of 19 needles were used to deposit 69 seeds in the prostate gland. The individual seed activity was 0.511 mCi. ? ?SpaceOAR:  No ? ?COMPLEX SIMULATION: At the end of the procedure, an anterior radiograph of the pelvis was obtained to document seed positioning and count. Cystoscopy was performed to check the urethra and bladder. ? ?MICRODOSIMETRY: At the end of the procedure, the patient was emitting 0.086 mR/hr at 1 meter. Accordingly, he was considered safe for hospital discharge. ? ?PLAN: The patient will return to the radiation oncology clinic for post implant CT dosimetry in three weeks. ? ? ?________________________________ ? ?Willie Oneal Willie Oneal, M.D. ? ? ? ?

## 2021-03-17 NOTE — Anesthesia Procedure Notes (Signed)
Procedure Name: Intubation ?Date/Time: 03/17/2021 3:20 PM ?Performed by: Suan Halter, CRNA ?Pre-anesthesia Checklist: Patient identified, Emergency Drugs available, Suction available and Patient being monitored ?Patient Re-evaluated:Patient Re-evaluated prior to induction ?Oxygen Delivery Method: Circle system utilized ?Preoxygenation: Pre-oxygenation with 100% oxygen ?Induction Type: IV induction ?Ventilation: Mask ventilation without difficulty ?Laryngoscope Size: Mac and 3 ?Grade View: Grade I ?Tube type: Oral ?Tube size: 7.0 mm ?Number of attempts: 1 ?Airway Equipment and Method: Stylet and Oral airway ?Placement Confirmation: ETT inserted through vocal cords under direct vision, positive ETCO2 and breath sounds checked- equal and bilateral ?Secured at: 22 cm ?Tube secured with: Tape ?Dental Injury: Teeth and Oropharynx as per pre-operative assessment  ? ? ? ? ?

## 2021-03-17 NOTE — Op Note (Signed)
PRE-OPERATIVE DIAGNOSIS:  Adenocarcinoma of the prostate ? ?POST-OPERATIVE DIAGNOSIS:  Same ? ?PROCEDURE:  Procedure(s): 1. I-125 radioactive seed implantation ?2. Cystoscopy ? ?SURGEON:  Surgeon(s): ?[name], MD ? ?Radiation oncologist: Tyler Pita, MD ? ?ANESTHESIA:  General ? ?EBL:  Minimal ? ?DRAINS: 63 French Foley catheter ? ?INDICATION: Willie Oneal. is a 71 year old with a history of T1c prostate cancer. After discussing treatment options he has elected to proceed with brachytherapy ? ?Description of procedure: After informed consent the patient was brought to the major OR, placed on the table and administered general anesthesia. He was then moved to the modified lithotomy position with his perineum perpendicular to the floor. His perineum and genitalia were then sterilely prepped. An official timeout was then performed. A 16 French Foley catheter was then placed in the bladder and filled with dilute contrast, a rectal tube was placed in the rectum and the transrectal ultrasound probe was placed in the rectum and affixed to the stand. He was then sterilely draped. ? ?Real time ultrasonography was used along with the seed planning software [employer] Prostate vs. 4.2.21. This was used to develop the seed plan including the number of needles as well as number of seeds required for complete and adequate coverage. Real-time ultrasonography was then used along with the previously developed plan and the Nucletron device to implant a total of 69 seeds using 19 needles. This proceeded without difficulty or complication. ? ? ?A Foley catheter was then removed as well as the transrectal ultrasound probe and rectal probe. Flexible cystoscopy was then performed using the 17 French flexible scope which revealed a normal urethra throughout its length down to the sphincter which appeared intact. The prostatic urethra revealed bilobar hypertrophy but no evidence of obstruction, seeds, spacers or lesions. The  bladder was then entered and fully and systematically inspected. The ureteral orifices were noted to be of normal configuration and position. The mucosa revealed no evidence of tumors. There were also no stones identified within the bladder. I noted no seeds or spacers on the floor of the bladder and retroflexion of the scope revealed no seeds protruding from the base of the prostate. ? ?The cystoscope was then removed and a new 37 French Foley catheter was then inserted and the balloon was filled with 10 cc of sterile water. This was connected to closed system drainage and the patient was awakened and taken to recovery room in stable and satisfactory condition. He tolerated procedure well and there were no intraoperative complications. ? ?

## 2021-03-17 NOTE — Interval H&P Note (Signed)
History and Physical Interval Note: ? ?03/17/2021 ?2:09 PM ? ?Willie Oneal.  has presented today for surgery, with the diagnosis of Prostate Cancer.  The various methods of treatment have been discussed with the patient and family. After consideration of risks, benefits and other options for treatment, the patient has consented to  Procedure(s): ?RADIOACTIVE SEED IMPLANT/BRACHYTHERAPY IMPLANT (N/A) ?SPACE OAR INSTILLATION (N/A) ?CYSTOSCOPY (N/A) as a surgical intervention.  The patient's history has been reviewed, patient examined, no change in status, stable for surgery.  I have reviewed the patient's chart and labs.  Questions were answered to the patient's satisfaction.   ? ? ?Willie Oneal ? ? ?

## 2021-03-18 ENCOUNTER — Encounter (HOSPITAL_BASED_OUTPATIENT_CLINIC_OR_DEPARTMENT_OTHER): Payer: Self-pay | Admitting: Urology

## 2021-03-18 NOTE — Anesthesia Postprocedure Evaluation (Signed)
Anesthesia Post Note ? ?Patient: Willie Oneal. ? ?Procedure(s) Performed: RADIOACTIVE SEED IMPLANT/BRACHYTHERAPY IMPLANT ?CYSTOSCOPY ? ?  ? ?Patient location during evaluation: PACU ?Anesthesia Type: General ?Level of consciousness: awake and alert ?Pain management: pain level controlled ?Vital Signs Assessment: post-procedure vital signs reviewed and stable ?Respiratory status: spontaneous breathing, nonlabored ventilation, respiratory function stable and patient connected to nasal cannula oxygen ?Cardiovascular status: blood pressure returned to baseline and stable ?Postop Assessment: no apparent nausea or vomiting ?Anesthetic complications: no ? ? ?No notable events documented. ? ?Last Vitals:  ?Vitals:  ? 03/17/21 1700 03/17/21 1742  ?BP: (!) 150/86 (!) 149/83  ?Pulse: 80 82  ?Resp: 17 15  ?Temp: 36.5 ?C 36.4 ?C  ?SpO2: 97% 99%  ?  ?Last Pain:  ?Vitals:  ? 03/17/21 1742  ?TempSrc:   ?PainSc: 0-No pain  ? ? ?  ?  ?  ?  ?  ?  ? ?Barnet Glasgow ? ? ? ? ?

## 2021-03-23 ENCOUNTER — Telehealth: Payer: Self-pay

## 2021-03-23 NOTE — Telephone Encounter (Signed)
See other note

## 2021-03-24 ENCOUNTER — Other Ambulatory Visit: Payer: Self-pay | Admitting: Urology

## 2021-03-24 ENCOUNTER — Telehealth: Payer: Self-pay

## 2021-03-24 MED ORDER — SULFAMETHOXAZOLE-TRIMETHOPRIM 800-160 MG PO TABS
1.0000 | ORAL_TABLET | Freq: Two times a day (BID) | ORAL | 0 refills | Status: AC
Start: 2021-03-24 — End: 2021-03-29

## 2021-03-24 NOTE — Telephone Encounter (Signed)
Patiant called and made aware. He advised he would go pick up medication now.  ? ?Thank you.  ?

## 2021-03-24 NOTE — Telephone Encounter (Signed)
Patient called this morning advising he was having severe pain with catheter and what appeared to be puss coming out of the top of his penis. He wanted to know if we could call him in an antibiotic to [facility] in [place]. Patient has appt tomorrow for cath removal at 9:30.  ?

## 2021-03-25 ENCOUNTER — Ambulatory Visit (INDEPENDENT_AMBULATORY_CARE_PROVIDER_SITE_OTHER): Payer: 59 | Admitting: Urology

## 2021-03-25 ENCOUNTER — Encounter: Payer: Self-pay | Admitting: Urology

## 2021-03-25 ENCOUNTER — Other Ambulatory Visit: Payer: Self-pay

## 2021-03-25 VITALS — BP 156/86 | HR 96

## 2021-03-25 DIAGNOSIS — C61 Malignant neoplasm of prostate: Secondary | ICD-10-CM

## 2021-03-25 NOTE — Progress Notes (Signed)
? ?Assessment: ?1. Prostate cancer ([place]); T1cNxMx; Gleason grade group 1 & 2; favorable intermediate risk; s/p LDR brachytherapy 03/17/21   ? ? ?Plan: ?Foley catheter removed today after successful voiding trial. ?Patient aware that he will need to return to the office later today if he has difficulty voiding. ?Complete antibiotics. ?Return to office in 3-4 weeks. ? ?Chief Complaint:  ?Chief Complaint  ?Patient presents with  ? Routine Post Op  ? ?History of Present Illness: ? ?Willie Oneal. is a 71 y.o. year old male who is seen for further evaluation of prostate cancer.   ?PSA results: ?7/22 5.8 ?10/22 6.1 ?He is not aware of any prior history of elevated PSA.  No history of prostatitis or UTIs.  No family history of prostate cancer.  His only urinary symptom is nocturia x 2.  No dysuria or gross hematuria. ?IPSS = 2. ? ?He does have a remote history of a kidney stone 20 years ago. ? ?He returns today following his transrectal ultrasound and biopsy of the prostate on 12/09/20. ?PSA: 5.8 ng/ml ?TRUS volume:  48.08 ml  ?Biopsy results:   ?          Gleason score: 3+ 3 = 6, 3 + 4 = 7 ?           # positive cores: 3/6 on right    1/6 on left ?           Location of cancer: mid gland, base ? ?Complications after biopsy: none ?Patient without significant LUTS.    ?Patient with erectile dysfunction.   ? ?The patient underwent low-dose rate brachytherapy on 03/17/2021.  A total of 69 seeds were implanted.  He was discharged with a Foley catheter in place. ?He presents today for a voiding trial. ?His Foley has been draining well.  He was started on antibiotics yesterday. ? ?Portions of the above documentation were copied from a prior visit for review purposes only. ? ? ?Past Medical History:  ?Past Medical History:  ?Diagnosis Date  ? Diabetes mellitus type 2   ? GERD (gastroesophageal reflux disease)   ? History of COVID-19   ? jan/feb 2020  ? History of COVID-19 01/2020  ? mild symptoms all symptoms all symptoms  resolved  ? History of gout   ? none in years  ? History of kidney stones   ? passed on won at least 15 yrs ago per pt on 03-15-2021  ? HTN (hypertension)   ? Hyperlipidemia   ? OsteoArthritis   ? Prostate cancer ([place])   ? ? ?Past Surgical History:  ?Past Surgical History:  ?Procedure Laterality Date  ? COLONOSCOPY N/A 07/21/2015  ? Procedure: COLONOSCOPY;  Surgeon: Rogene Houston, MD;  [facility];  Service: Endoscopy;  Laterality: N/A;  730  ? CYSTOSCOPY N/A 03/17/2021  ? Procedure: CYSTOSCOPY;  Surgeon: Cleon Gustin, MD;  Location: [facility];  Service: Urology;  Laterality: N/A;  ? left shoudler surgery    ? in high school result of football accident  ? RADIOACTIVE SEED IMPLANT N/A 03/17/2021  ? Procedure: RADIOACTIVE SEED IMPLANT/BRACHYTHERAPY IMPLANT;  Surgeon: Cleon Gustin, MD;  Location: [facility];  Service: Urology;  Laterality: N/A;  ? right shoulder arthroplasty  1999  ? right shoulder exploration and implantation of antibiotic spacer 05-02-2010    ? TONSILLECTOMY  1963  ? and adenoids  ? vasectomy  1995  ? ? ?Allergies:  ?Allergies  ?Allergen Reactions  ?  Penicillins   ?  Unspecified reaction as child  ? ? ?Family History:  ?No family history on file. ? ?Social History:  ?Social History  ? ?Tobacco Use  ? Smoking status: Former  ?  Packs/day: 1.00  ?  Years: 30.00  ?  Pack years: 30.00  ?  Types: Cigarettes  ?  Quit date: 01/03/2000  ?  Years since quitting: 21.2  ? Smokeless tobacco: Never  ?Vaping Use  ? Vaping Use: Never used  ?Substance Use Topics  ? Alcohol use: No  ? Drug use: No  ? ?ROS: ?Constitutional:  Negative for fever, chills, weight loss ?CV: Negative for chest pain, previous MI, hypertension ?Respiratory:  Negative for shortness of breath, wheezing, sleep apnea, frequent cough ?GI:  Negative for nausea, vomiting, bloody stool, GERD ? ?Physical exam: ?BP (!) 156/86   Pulse 96  ?GENERAL APPEARANCE:  Well appearing, well developed, well  nourished, NAD ?HEENT:  Atraumatic, normocephalic, oropharynx clear ?NECK:  Supple without lymphadenopathy or thyromegaly ?ABDOMEN:  Soft, non-tender, no masses ?EXTREMITIES:  Moves all extremities well, without clubbing, cyanosis, or edema ?NEUROLOGIC:  Alert and oriented x 3, normal gait, CN II-XII grossly intact ?MENTAL STATUS:  appropriate ?BACK:  Non-tender to palpation, No CVAT ?SKIN:  Warm, dry, and intact ? ? ?Results: ?None ? ?Voiding trial: ?Volume instilled: 170 ml ?Volume voided: 130 ml ? ? ?

## 2021-03-25 NOTE — Progress Notes (Signed)
Fill and Pull Catheter Removal ? ?Patient is present today for a catheter removal.  Patient was cleaned and prepped in a sterile fashion 149m of sterile water/ saline was instilled into the bladder when the patient felt the urge to urinate. 179mof water was then drained from the balloon.  A 16FR foley cath was removed from the bladder no complications were noted .  Patient as then given some time to void on their own.  Patient can void  13070mn their own after some time.  Patient tolerated well. ? ?Performed by: AmaEstill Bamberg ? ?Follow up/ Additional notes: MD to see  ?

## 2021-03-26 ENCOUNTER — Other Ambulatory Visit: Payer: Self-pay | Admitting: Urology

## 2021-03-26 ENCOUNTER — Encounter (HOSPITAL_COMMUNITY): Payer: Self-pay

## 2021-03-26 ENCOUNTER — Emergency Department (HOSPITAL_COMMUNITY)
Admission: EM | Admit: 2021-03-26 | Discharge: 2021-03-26 | Disposition: A | Discharge status: Home / Self Care | Payer: Medicare Other | Attending: Emergency Medicine | Admitting: Emergency Medicine

## 2021-03-26 ENCOUNTER — Other Ambulatory Visit: Payer: Self-pay

## 2021-03-26 DIAGNOSIS — Z859 Personal history of malignant neoplasm, unspecified: Secondary | ICD-10-CM | POA: Insufficient documentation

## 2021-03-26 DIAGNOSIS — E119 Type 2 diabetes mellitus without complications: Secondary | ICD-10-CM | POA: Diagnosis not present

## 2021-03-26 DIAGNOSIS — Z7984 Long term (current) use of oral hypoglycemic drugs: Secondary | ICD-10-CM | POA: Diagnosis not present

## 2021-03-26 DIAGNOSIS — R39198 Other difficulties with micturition: Secondary | ICD-10-CM | POA: Insufficient documentation

## 2021-03-26 DIAGNOSIS — R1030 Lower abdominal pain, unspecified: Secondary | ICD-10-CM | POA: Diagnosis not present

## 2021-03-26 DIAGNOSIS — R339 Retention of urine, unspecified: Secondary | ICD-10-CM | POA: Diagnosis not present

## 2021-03-26 LAB — URINALYSIS, ROUTINE W REFLEX MICROSCOPIC
Bacteria, UA: NONE SEEN
Bilirubin Urine: NEGATIVE
Glucose, UA: >=500 mg/dL — AB
Ketones, ur: NEGATIVE mg/dL
Leukocytes,Ua: NEGATIVE
Nitrite: NEGATIVE
Protein, ur: 30 mg/dL — AB
Specific Gravity, Urine: 1.025 (ref 1.005–1.030)
pH: 5 (ref 5.0–8.0)

## 2021-03-26 NOTE — ED Notes (Signed)
BLADDER SCAN: 570 ?

## 2021-03-26 NOTE — ED Provider Notes (Signed)
[facility] ?Provider Note ? ? ?CSN: [id number] ?Arrival date & time: 03/26/21  1304 ? ?  ? ?History ? ?Chief Complaint  ?Patient presents with  ? Urinary Retention  ? ? ?Willie Oneal. is a 71 y.o. male. ? ?HPI ? ?Patient medical history including diabetes, hyperlipidemia, cancer with a indwelling Foley presents  with chief complaint of difficulty urination.  Patient said he went to his urologist yesterday where they removed his urinary catheter,  as it was causing too much pain.  He states he was able to void upon his discharge from the urologist office yesterday But when he got home he was having difficulty urinating and then this morning  was unable to urinate at all,  started to develop suprapubic pain, he is having no flank tenderness no fevers no chills nausea vomiting general body aches. ? ?I reviewed patient's chart patient to be followed by Dr. Leory Plowman of urology he had a 18 French catheter removed yesterday and was voiding slowly but without difficulty.   ? ?Home Medications ?Prior to Admission medications   ?Medication Sig Start Date End Date Taking? Authorizing Provider  ?allopurinol (ZYLOPRIM) 100 MG tablet Take 100 mg by mouth daily.    [provider]  ?atenolol (TENORMIN) 50 MG tablet Take 50 mg by mouth daily. 01/21/21   [provider]  ?FARXIGA 10 MG TABS tablet Take 10 mg by mouth daily. 02/16/21   [provider]  ?glipiZIDE (GLUCOTROL XL) 10 MG 24 hr tablet Take 10 mg by mouth daily with breakfast.    [provider]  ?lisinopril (PRINIVIL,ZESTRIL) 10 MG tablet Take 10 mg by mouth daily.    [provider]  ?metFORMIN (GLUCOPHAGE-XR) 500 MG 24 hr tablet Take 1,000 mg by mouth 2 (two) times daily. 01/21/21   [provider]  ?omeprazole (PRILOSEC) 20 MG capsule Take 20 mg by mouth at bedtime.    [provider]  ?simvastatin (ZOCOR) 20 MG tablet Take 20 mg by mouth daily.    [provider]   ?sulfamethoxazole-trimethoprim (BACTRIM DS) 800-160 MG tablet Take 1 tablet by mouth every 12 (twelve) hours for 5 days. 03/24/21 03/29/21  Stoneking, Reece Leader., MD  ?traMADol (ULTRAM) 50 MG tablet Take 1 tablet (50 mg total) by mouth every 6 (six) hours as needed. 03/17/21 03/17/22  Cleon Gustin, MD  ?   ? ?Allergies    ?Penicillins   ? ?Review of Systems   ?Review of Systems  ?Constitutional:  Negative for chills and fever.  ?Respiratory:  Negative for shortness of breath.   ?Cardiovascular:  Negative for chest pain.  ?Gastrointestinal:  Negative for abdominal pain.  ?Genitourinary:  Positive for difficulty urinating.  ?Neurological:  Negative for headaches.  ? ?Physical Exam ?Updated Vital Signs ?BP (!) 127/91   Pulse 75   Temp 97.7 ?F (36.5 ?C)   Resp 17   Ht '5\' 10"'$  (1.778 m)   Wt 90.7 kg   SpO2 98%   BMI 28.70 kg/m?  ?Physical Exam ?Vitals and nursing note reviewed.  ?Constitutional:   ?   General: He is not in acute distress. ?   Appearance: He is not ill-appearing.  ?HENT:  ?   Head: Normocephalic and atraumatic.  ?   Nose: No congestion.  ?Eyes:  ?   Conjunctiva/sclera: Conjunctivae normal.  ?Cardiovascular:  ?   Rate and Rhythm: Normal rate and regular rhythm.  ?Pulmonary:  ?   Effort: Pulmonary effort is normal.  ?  Abdominal:  ?   Palpations: Abdomen is soft.  ?   Tenderness: There is no abdominal tenderness. There is no right CVA tenderness or left CVA tenderness.  ?Skin: ?   General: Skin is warm and dry.  ?Neurological:  ?   Mental Status: He is alert.  ?Psychiatric:     ?   Mood and Affect: Mood normal.  ? ? ?ED Results / Procedures / Treatments   ?Labs ?(all labs ordered are listed, but only abnormal results are displayed) ?Labs Reviewed  ?URINALYSIS, ROUTINE W REFLEX MICROSCOPIC - Abnormal; Notable for the following components:  ?    Result Value  ? Glucose, UA >=500 (*)   ? Hgb urine dipstick MODERATE (*)   ? Protein, ur 30 (*)   ? All other components within normal limits   ? ? ?EKG ?None ? ?Radiology ?No results found. ? ?Procedures ?Procedures  ? ? ?Medications Ordered in ED ?Medications - No data to display ? ?ED Course/ Medical Decision Making/ A&P ?  ?                        ?Medical Decision Making ?Amount and/or Complexity of Data Reviewed ?Labs: ordered. ? ? ?This patient presents to the ED for concern of difficulty with urination, this involves an extensive number of treatment options, and is a complaint that carries with it a high risk of complications and morbidity.  The differential diagnosis includes UTI, urinary obstruction, [place] ? ? ? ?Additional history obtained: ? ?Additional history obtained from electron medical record ?External records from outside source obtained and reviewed including please see HPI for further detail ? ? ?Co morbidities that complicate the patient evaluation ? ?Prostate cancer ? ?Social Determinants of Health: ? ?N/A ? ? ? ?Lab Tests: ? ?I Ordered, and personally interpreted labs.  The pertinent results include: UA unremarkable ? ? ?Imaging Studies ordered: ? ?I ordered imaging studies including bladder scan ?I independently visualized and interpreted imaging which showed showed 800 mL of fluid ?I agree with the radiologist interpretation ? ? ?Cardiac Monitoring: ? ?The patient was maintained on a cardiac monitor.  I personally viewed and interpreted the cardiac monitored which showed an underlying rhythm of: N/A ? ? ?Medicines ordered and prescription drug management: ? ?I ordered medication including N/A ?I have reviewed the patients home medicines and have made adjustments as needed ? ?Critical Interventions: ? ?N/A ? ? ?Reevaluation: ? ?Present with significant suprapubic pain, bladder scan showed 800 mL of fluids, likely he is retaining, will place Foley catheter in. ? ?Patient was reassessed drained out 800 mL of fluid, he is feeling much better, he has no more suprapubic tenderness, will obtain UA and reassess ? ?Patient updated on lab  work and is agreed for discharge. ? ?Consultations Obtained: ? ?N/A ? ? ?Rule out ?Low system for UTI, pyelo-, kidney stone is using for signs infection, afebrile nontachycardic nontoxic-appearing. ? ? ? ?Dispostion and problem list ? ?After consideration of the diagnostic results and the patients response to treatment, I feel that the patent would benefit from discharge.  ? ?Urinary retention-since resolved likely secondary due to prostate cancer, will leave Foley in, have follow-up with urologist for further evaluation if strict return precautions. ? ? ? ? ? ? ? ? ? ? ? ?Final Clinical Impression(s) / ED Diagnoses ?Final diagnoses:  ?Urinary retention  ? ? ?Rx / DC Orders ?ED Discharge Orders   ? ? None  ? ?  ? ? ?  ?  Marcello Fennel, PA-C ?03/26/21 1459 ? ?  ?Milton Ferguson, MD ?03/28/21 1126 ? ?

## 2021-03-26 NOTE — Discharge Instructions (Signed)
Please continue with  all home medications. ? ?Follow-up with urologist for further evaluation. ? ?Come back to the emergency department if you develop chest pain, shortness of breath, severe abdominal pain, uncontrolled nausea, vomiting, diarrhea. ? ?

## 2021-03-26 NOTE — ED Triage Notes (Signed)
Pt states he had his foley catheter removed yesterday and states he has had little output today since it was removed.  ?

## 2021-03-28 ENCOUNTER — Telehealth: Payer: Self-pay

## 2021-03-28 ENCOUNTER — Telehealth: Payer: Self-pay | Admitting: *Deleted

## 2021-03-28 ENCOUNTER — Other Ambulatory Visit: Payer: Self-pay | Admitting: Urology

## 2021-03-28 MED ORDER — TRAMADOL HCL 50 MG PO TABS: 50.0000 mg | ORAL_TABLET | Freq: Four times a day (QID) | ORAL | 0 refills | Status: DC | PRN

## 2021-03-28 MED ORDER — SILODOSIN 8 MG PO CAPS: 8.0000 mg | ORAL_CAPSULE | Freq: Every day | ORAL | 5 refills | Status: DC

## 2021-03-28 NOTE — Telephone Encounter (Signed)
Patient had cath changed and was put back in at ER. ?He is in pain and requesting pain medication. ?Also wants to know what the plan of care for him is? ? ?Please advise. ? ?Thanks, ?Helene Kelp ?

## 2021-03-28 NOTE — Telephone Encounter (Signed)
RETURNED PATIENT'S PHONE CALL. ?

## 2021-03-28 NOTE — Telephone Encounter (Signed)
Patient reports new catheter was placed in ER ( 2 days after voiding trial) 1L urine returned per patient. Please advise if patient needs medication prior to having catheter removed. I will schedule appt once I hear back. ?

## 2021-03-30 ENCOUNTER — Encounter: Payer: Self-pay | Admitting: Urology

## 2021-03-30 ENCOUNTER — Ambulatory Visit: Payer: 59 | Admitting: Urology

## 2021-03-30 NOTE — Telephone Encounter (Signed)
Patient called and made aware. Reports started rapaflo 1 day ago. Voiding trial scheduled for next Monday.  ?

## 2021-04-04 ENCOUNTER — Ambulatory Visit (INDEPENDENT_AMBULATORY_CARE_PROVIDER_SITE_OTHER): Payer: 59 | Admitting: Urology

## 2021-04-04 ENCOUNTER — Encounter: Payer: Self-pay | Admitting: Urology

## 2021-04-04 VITALS — BP 157/82 | HR 96 | Ht 70.0 in | Wt 195.0 lb

## 2021-04-04 DIAGNOSIS — N9989 Other postprocedural complications and disorders of genitourinary system: Secondary | ICD-10-CM | POA: Diagnosis not present

## 2021-04-04 DIAGNOSIS — R338 Other retention of urine: Secondary | ICD-10-CM | POA: Diagnosis not present

## 2021-04-04 DIAGNOSIS — C61 Malignant neoplasm of prostate: Secondary | ICD-10-CM

## 2021-04-04 MED ORDER — CIPROFLOXACIN HCL 500 MG PO TABS
500.0000 mg | ORAL_TABLET | Freq: Once | ORAL | Status: AC
  Administered 2021-04-04: 500 mg via ORAL

## 2021-04-04 NOTE — Progress Notes (Signed)
? ?Assessment: ?1. Postoperative urinary retention   ?2. Prostate cancer ([place]); T1cNxMx; Gleason grade group 1 & 2; favorable intermediate risk; s/p LDR brachytherapy 03/17/21   ? ? ?Plan: ?Foley catheter removed today after successful voiding trial today. ?Patient and his daughter made aware that he will need to return to the office later today if he has difficulty voiding. ?I recommended that he return to the office later today for a bladder scan. ?Continue silodosin 8 mg daily. ?Cipro x1 following Foley removal. ?Patient and his daughter were given supplies for in and out catheterization as well as instructions ?Return to office in 7-10 days with bladder scan ? ?During the visit today, the patient's daughter who was present during the visit became very upset regarding our care of her father.  She accused me and the office staff of inappropriate care at the time of his last visit on 03/25/2021 and of falsely documenting the patient's voiding trial from 03/25/2021.  Willie Oneal did not remember having the procedure done although it was clearly documented in the chart.  [name], RN performed the voiding trial and documented such.  She was present during the discussion today as well.  The patient's wife was present for the patient's previous visit including the voiding trial.  Unfortunately, she was not available to confirm the events at today's visit.  The patient's daughter accused me of calling her father a "liar" as I attempted to explain what had transpired at his prior visit after reviewing the chart.  Ultimately, the patient admitted that he may be confused about the events during his prior visit.  Unfortunately, my attempts to calmly explain the situation to the patient's daughter were unsuccessful.  The patient was given self catheterization supplies at the daughter's request.  She stated that she would catheterize him if necessary.  Both [name] and I recommended that the patient to return to the office  later today for a bladder scan.  The patient and his daughter expressed understanding of these recommendations and plans for his continued care.  I notified [name], our office manager, of the above events. ? ?Chief Complaint:  ?Chief Complaint  ?Patient presents with  ? Urinary Retention  ? ?History of Present Illness: ? ?Willie Oneal. is a 71 y.o. year old male who is seen for further evaluation of prostate cancer.   ?PSA results: ?7/22 5.8 ?10/22 6.1 ?He is not aware of any prior history of elevated PSA.  No history of prostatitis or UTIs.  No family history of prostate cancer.  His only urinary symptom is nocturia x 2.  No dysuria or gross hematuria. ?IPSS = 2. ? ?He does have a remote history of a kidney stone 20 years ago. ? ?He returns today following his transrectal ultrasound and biopsy of the prostate on 12/09/20. ?PSA: 5.8 ng/ml ?TRUS volume:  48.08 ml  ?Biopsy results:   ?          Gleason score: 3+ 3 = 6, 3 + 4 = 7 ?           # positive cores: 3/6 on right    1/6 on left ?           Location of cancer: mid gland, base ? ?Complications after biopsy: none ?Patient without significant LUTS.    ?Patient with erectile dysfunction.   ? ?The patient underwent low-dose rate brachytherapy on 03/17/2021.  A total of 69 seeds were implanted.  He was discharged with a Foley  catheter in place. ?His Foley was removed on 03/25/2021 following a successful voiding trial.  Unfortunately, he developed urinary retention requiring placement of a Foley catheter on 03/26/2021. ?He was started on silodosin. ? ?He presents today for a voiding trial. ?His Foley has been draining well.  He continues on silodosin. ? ?Portions of the above documentation were copied from a prior visit for review purposes only. ? ? ?Past Medical History:  ?Past Medical History:  ?Diagnosis Date  ? Diabetes mellitus type 2   ? GERD (gastroesophageal reflux disease)   ? History of COVID-19   ? jan/feb 2020  ? History of COVID-19 01/2020  ? mild  symptoms all symptoms all symptoms resolved  ? History of gout   ? none in years  ? History of kidney stones   ? passed on won at least 15 yrs ago per pt on 03-15-2021  ? HTN (hypertension)   ? Hyperlipidemia   ? OsteoArthritis   ? Prostate cancer ([place])   ? ? ?Past Surgical History:  ?Past Surgical History:  ?Procedure Laterality Date  ? COLONOSCOPY N/A 07/21/2015  ? Procedure: COLONOSCOPY;  Surgeon: Rogene Houston, MD;  [facility];  Service: Endoscopy;  Laterality: N/A;  730  ? CYSTOSCOPY N/A 03/17/2021  ? Procedure: CYSTOSCOPY;  Surgeon: Cleon Gustin, MD;  Location: [facility];  Service: Urology;  Laterality: N/A;  ? left shoudler surgery    ? in high school result of football accident  ? RADIOACTIVE SEED IMPLANT N/A 03/17/2021  ? Procedure: RADIOACTIVE SEED IMPLANT/BRACHYTHERAPY IMPLANT;  Surgeon: Cleon Gustin, MD;  Location: [facility];  Service: Urology;  Laterality: N/A;  ? right shoulder arthroplasty  1999  ? right shoulder exploration and implantation of antibiotic spacer 05-02-2010    ? TONSILLECTOMY  1963  ? and adenoids  ? vasectomy  1995  ? ? ?Allergies:  ?Allergies  ?Allergen Reactions  ? Penicillins   ?  Unspecified reaction as child  ? ? ?Family History:  ?History reviewed. No pertinent family history. ? ?Social History:  ?Social History  ? ?Tobacco Use  ? Smoking status: Former  ?  Packs/day: 1.00  ?  Years: 30.00  ?  Pack years: 30.00  ?  Types: Cigarettes  ?  Quit date: 01/03/2000  ?  Years since quitting: 21.2  ? Smokeless tobacco: Never  ?Vaping Use  ? Vaping Use: Never used  ?Substance Use Topics  ? Alcohol use: No  ? Drug use: No  ? ?ROS: ?Constitutional:  Negative for fever, chills, weight loss ?CV: Negative for chest pain, previous MI, hypertension ?Respiratory:  Negative for shortness of breath, wheezing, sleep apnea, frequent cough ?GI:  Negative for nausea, vomiting, bloody stool, GERD ? ?Physical exam: ?BP (!) 157/82   Pulse 96   Ht  '5\' 10"'$  (1.778 m)   Wt 195 lb (88.5 kg)   BMI 27.98 kg/m?  ?GENERAL APPEARANCE:  Well appearing, well developed, well nourished, NAD ?HEENT:  Atraumatic, normocephalic, oropharynx clear ?NECK:  Supple without lymphadenopathy or thyromegaly ?ABDOMEN:  Soft, non-tender, no masses ?EXTREMITIES:  Moves all extremities well, without clubbing, cyanosis, or edema ?NEUROLOGIC:  Alert and oriented x 3, normal gait, CN II-XII grossly intact ?MENTAL STATUS:  appropriate ?BACK:  Non-tender to palpation, No CVAT ?SKIN:  Warm, dry, and intact ? ? ?Results: ?None ? ?Voiding trial: ?Volume instilled: 270 ml ?Volume voided: 220 ml ? ? ?

## 2021-04-04 NOTE — Progress Notes (Signed)
Patient returned to office alone for a post void residual =40m.  Patient has been able to void 3047mon his own since his visit this morning.   ? ? ?Beya Tipps, CMA ?

## 2021-04-04 NOTE — Progress Notes (Signed)
Fill and Pull Catheter Removal ? ?Patient is present today for a catheter removal.  Patient was cleaned and prepped in a sterile fashion 277m of sterile water/ saline was instilled into the bladder when the patient felt the urge to urinate. 181mof water was then drained from the balloon.  A 14FR foley cath was removed from the bladder no complications were noted .  Patient as then given some time to void on their own.  Patient can void  22038mn their own after some time.  Patient tolerated well. ? ?Performed by: KouLevi AlandMA ? ?Follow up/ Additional notes: Follow up as scheduled.   ?

## 2021-04-04 NOTE — Progress Notes (Signed)
I was called into patient room during office visit to confirm I was treating nurse at last office visit. Dr. Felipa Eth present in room whole time. ?As documented at last office visit- voiding trial was completed as documented. Daughter with patient very angry and stated, " he says it wasn't done and just because its documented doesnt mean you did it. " I discussed with patient and daughter at last visit, patient had wife present in room during voiding trial and was completed in room 3 as charted. Patient states, " okay, I didn't ask my wife, I was in a lot of pain I think so I don't remember" daughter still upset raising voice that he should not have had to go to ER next day if he passed his voiding trial. I discussed with daughter he passed his voiding trial as documented without any problems noted and unfortunately, next day ER trip was an unforeseen circumstance. Daughter still upset yelling and pointing her fingers and stating " my dad is worried he will not receive good care now because of how I am acting" I reassured patient we would continue to treat Willie Oneal with respect and excellent care as we have done thus far.  ? ? ?

## 2021-04-05 ENCOUNTER — Telehealth: Payer: Self-pay | Admitting: *Deleted

## 2021-04-05 NOTE — Telephone Encounter (Signed)
CALLED PATIENT TO REMIND OF POST SEED APPTS. FOR 04-06-21, SPOKE WITH PATIENT AND HE IS AWARE OF THESE APPTS. ?

## 2021-04-05 NOTE — Telephone Encounter (Signed)
XXXX 

## 2021-04-06 ENCOUNTER — Other Ambulatory Visit: Payer: Self-pay

## 2021-04-06 ENCOUNTER — Ambulatory Visit
Admission: RE | Admit: 2021-04-06 | Discharge: 2021-04-06 | Disposition: A | Discharge status: Home / Self Care | Payer: Medicare Other | Source: Ambulatory Visit | Attending: Radiation Oncology | Admitting: Radiation Oncology

## 2021-04-06 ENCOUNTER — Encounter: Payer: Self-pay | Admitting: Urology

## 2021-04-06 ENCOUNTER — Ambulatory Visit
Admission: RE | Admit: 2021-04-06 | Discharge: 2021-04-06 | Disposition: A | Discharge status: Home / Self Care | Payer: Medicare Other | Source: Ambulatory Visit | Attending: Urology | Admitting: Urology

## 2021-04-06 VITALS — BP 136/81 | HR 95 | Temp 96.9°F | Resp 18 | Ht 70.0 in | Wt 200.4 lb

## 2021-04-06 DIAGNOSIS — C61 Malignant neoplasm of prostate: Secondary | ICD-10-CM | POA: Insufficient documentation

## 2021-04-06 DIAGNOSIS — R350 Frequency of micturition: Secondary | ICD-10-CM | POA: Insufficient documentation

## 2021-04-06 DIAGNOSIS — Z79899 Other long term (current) drug therapy: Secondary | ICD-10-CM | POA: Insufficient documentation

## 2021-04-06 DIAGNOSIS — R3911 Hesitancy of micturition: Secondary | ICD-10-CM | POA: Insufficient documentation

## 2021-04-06 DIAGNOSIS — Z923 Personal history of irradiation: Secondary | ICD-10-CM | POA: Insufficient documentation

## 2021-04-06 DIAGNOSIS — R300 Dysuria: Secondary | ICD-10-CM | POA: Insufficient documentation

## 2021-04-06 DIAGNOSIS — Z7984 Long term (current) use of oral hypoglycemic drugs: Secondary | ICD-10-CM | POA: Insufficient documentation

## 2021-04-06 NOTE — Progress Notes (Signed)
?Radiation Oncology         [phone] ?________________________________ ? ?Name: Willie Oneal. MRN: [id number]  ?Date: 04/06/2021  DOB: 25-Jul-1950 ? ?Post-Seed Follow-Up Visit Note ? ?CC: Celene Squibb, MD  Primus Bravo., * ? ?Diagnosis:   71 y.o. gentleman with Stage T1c adenocarcinoma of the prostate with Gleason Score of 3+4, and PSA of 6.1. ? ?  ICD-10-CM   ?1. Malignant neoplasm of prostate ([place])  C61   ?  ?2. Prostate cancer ([place]); T1cNxMx; Gleason grade group 1 & 2; favorable intermediate risk  C61   ?  ? ? ?Interval Since Last Radiation:  3 weeks ?03/17/21:  Insertion of radioactive I-125 seeds into the prostate gland; 145 Gy, definitive therapy. ? ?Narrative:  The patient returns today for routine follow-up.  He is complaining of increased urinary frequency and urinary hesitation symptoms. He filled out a questionnaire regarding urinary function today providing and overall IPSS score of 21 characterizing his symptoms as severe but improving.  He has struggled with postprocedure urinary retention requiring prolonged Foley catheterization but was recently able to have the Foley catheter removed in the urology office on 04/04/2021 and has been able to void on his own since that time.  He is taking Rapaflo daily as prescribed and feels like he is making good progress.  He continues with some mild dysuria at the start of his stream as well as a weaker flow of stream, hesitancy and intermittency but denies gross hematuria, straining to void or incontinence.  His pre-implant score was 6. He denies any abdominal pain or bowel symptoms.  He reports a healthy appetite and is maintaining his weight.  Overall, he is pleased with his progress to date. ? ?ALLERGIES:  is allergic to penicillins. ? ?Meds: ?Current Outpatient Medications  ?Medication Sig Dispense Refill  ? tamsulosin (FLOMAX) 0.4 MG CAPS capsule Take 0.4 mg by mouth.    ? allopurinol (ZYLOPRIM) 100 MG tablet Take 100 mg by mouth daily.    ?  atenolol (TENORMIN) 50 MG tablet Take 50 mg by mouth daily.    ? FARXIGA 10 MG TABS tablet Take 10 mg by mouth daily.    ? glipiZIDE (GLUCOTROL XL) 10 MG 24 hr tablet Take 10 mg by mouth daily with breakfast.    ? lisinopril (PRINIVIL,ZESTRIL) 10 MG tablet Take 10 mg by mouth daily.    ? metFORMIN (GLUCOPHAGE-XR) 500 MG 24 hr tablet Take 1,000 mg by mouth 2 (two) times daily.    ? omeprazole (PRILOSEC) 20 MG capsule Take 20 mg by mouth at bedtime.    ? silodosin (RAPAFLO) 8 MG CAPS capsule Take 1 capsule (8 mg total) by mouth daily with breakfast. 30 capsule 5  ? simvastatin (ZOCOR) 20 MG tablet Take 20 mg by mouth daily.    ? traMADol (ULTRAM) 50 MG tablet TAKE 1 TABLET BY MOUTH EVERY 6 HOURS AS NEEDED. 15 tablet 0  ? traMADol (ULTRAM) 50 MG tablet Take 1 tablet (50 mg total) by mouth every 6 (six) hours as needed. 15 tablet 0  ? ?No current facility-administered medications for this encounter.  ? ? ?Physical Findings: ?In general this is a well appearing Caucasian male in no acute distress. He's alert and oriented x4 and appropriate throughout the examination. Cardiopulmonary assessment is negative for acute distress and he exhibits normal effort.  ? ?Lab Findings: ?Lab Results  ?Component Value Date  ? WBC 7.8 03/15/2021  ? HGB 17.1 (H) 03/15/2021  ? HCT  50.5 03/15/2021  ? MCV 86.6 03/15/2021  ? PLT 210 03/15/2021  ? ? ?Radiographic Findings:  ?Patient underwent CT imaging in our clinic for post implant dosimetry. The CT will be reviewed by Dr. Tammi Klippel to confirm there is an adequate distribution of radioactive seeds throughout the prostate gland and ensure that there are no seeds in or near the rectum.  We suspect the final radiation plan and dosimetry will show appropriate coverage of the prostate gland. He understands that we will call and inform him of any unexpected findings on further review of his imaging and dosimetry. ? ?Impression/Plan: 71 y.o. gentleman with Stage T1c adenocarcinoma of the prostate  with Gleason Score of 3+4, and PSA of 6.1. ?The patient is recovering from the effects of radiation. His urinary symptoms should gradually improve over the next 4-6 months. We talked about this today. He is encouraged by his improvement already and is otherwise pleased with his outcome. We also talked about long-term follow-up for prostate cancer following seed implant. He understands that ongoing PSA determinations and digital rectal exams will help perform surveillance to rule out disease recurrence. He has a follow up appointment scheduled with Dr. Felipa Eth on 04/29/2021. He understands what to expect with his PSA measures. Patient was also educated today about some of the long-term effects from radiation including a small risk for rectal bleeding and possibly erectile dysfunction. We talked about some of the general management approaches to these potential complications. However, I did encourage the patient to contact our office or return at any point if he has questions or concerns related to his previous radiation and prostate cancer. ? ? ? ?Nicholos Johns, PA-C ? ?

## 2021-04-06 NOTE — Progress Notes (Signed)
?  Radiation Oncology         [phone] ?________________________________ ? ?Name: Willie Oneal. MRN: [id number]  ?Date: 04/06/2021  DOB: [birth year] ? ?COMPLEX SIMULATION NOTE ? ?NARRATIVE:  The patient was brought to the [facility] today following prostate seed implantation approximately one month ago.  Identity was confirmed.  All relevant records and images related to the planned course of therapy were reviewed.  Then, the patient was set-up supine.  CT images were obtained.  The CT images were loaded into the planning software.  Then the prostate and rectum were contoured.  Treatment planning then occurred.  The implanted iodine 125 seeds were identified by the physics staff for projection of radiation distribution  I have requested : 3D Simulation  I have requested a DVH of the following structures: Prostate and rectum.   ? ?________________________________ ? ?[name], M.D. ? ?

## 2021-04-06 NOTE — Progress Notes (Signed)
Post-Seed appointment. I verified patient identity and began nursing interview. Patient reports some dysuria 4/10. No other issues reported at this time. ? ?Meaningful use complete. ?I-PSS score of 21 (severe). ?Currently on Flomax 0.'4mg'$  as directed. ?Urology appointment- Early May, 2023 ? ?BP 136/81 (BP Location: Left Arm, Patient Position: Sitting, Cuff Size: Normal)   Pulse 95   Temp (!) 96.9 ?F (36.1 ?C) (Temporal)   Resp 18   Ht '5\' 10"'$  (1.778 m)   Wt 200 lb 6 oz (90.9 kg)   SpO2 97%   BMI 28.75 kg/m?  ? ?

## 2021-04-18 ENCOUNTER — Ambulatory Visit: Payer: 59 | Admitting: Urology

## 2021-04-27 ENCOUNTER — Encounter: Payer: Self-pay | Admitting: Radiation Oncology

## 2021-04-27 DIAGNOSIS — Z191 Hormone sensitive malignancy status: Secondary | ICD-10-CM | POA: Diagnosis not present

## 2021-04-27 DIAGNOSIS — C61 Malignant neoplasm of prostate: Secondary | ICD-10-CM | POA: Diagnosis not present

## 2021-04-29 ENCOUNTER — Encounter: Payer: Self-pay | Admitting: Urology

## 2021-04-29 ENCOUNTER — Ambulatory Visit (INDEPENDENT_AMBULATORY_CARE_PROVIDER_SITE_OTHER): Payer: 59 | Admitting: Urology

## 2021-04-29 VITALS — BP 171/85 | HR 99

## 2021-04-29 DIAGNOSIS — C61 Malignant neoplasm of prostate: Secondary | ICD-10-CM

## 2021-04-29 DIAGNOSIS — R339 Retention of urine, unspecified: Secondary | ICD-10-CM

## 2021-04-29 DIAGNOSIS — Z87898 Personal history of other specified conditions: Secondary | ICD-10-CM

## 2021-04-29 LAB — BLADDER SCAN AMB NON-IMAGING: Scan Result: 75

## 2021-04-29 LAB — URINALYSIS, ROUTINE W REFLEX MICROSCOPIC
Bilirubin, UA: NEGATIVE
Ketones, UA: NEGATIVE
Leukocytes,UA: NEGATIVE
Nitrite, UA: NEGATIVE
Specific Gravity, UA: 1.025 (ref 1.005–1.030)
Urobilinogen, Ur: 0.2 mg/dL (ref 0.2–1.0)
pH, UA: 5 (ref 5.0–7.5)

## 2021-04-29 LAB — MICROSCOPIC EXAMINATION
Bacteria, UA: NONE SEEN
Renal Epithel, UA: NONE SEEN /hpf
WBC, UA: NONE SEEN /hpf (ref 0–5)

## 2021-04-29 NOTE — Progress Notes (Signed)
? ?Assessment: ?1. Prostate cancer ([place]); T1cNxMx; Gleason grade group 1 & 2; favorable intermediate risk; s/p LDR brachytherapy 03/17/21   ?2. History of urinary retention   ? ? ? ?Plan: ?Continue silodosin 8 mg daily. ?Return to office in 2 months ?PSA next visit ? ?Chief Complaint:  ?Chief Complaint  ?Patient presents with  ? Urinary Retention  ? ?History of Present Illness: ? ?Willie Oneal. is a 71 y.o. year old male who is seen for further evaluation of prostate cancer.   ?PSA results: ?7/22 5.8 ?10/22 6.1 ?He is not aware of any prior history of elevated PSA.  No history of prostatitis or UTIs.  No family history of prostate cancer.  His only urinary symptom is nocturia x 2.  No dysuria or gross hematuria. ?IPSS = 2. ? ?He does have a remote history of a kidney stone 20 years ago. ? ?He returns today following his transrectal ultrasound and biopsy of the prostate on 12/09/20. ?PSA: 5.8 ng/ml ?TRUS volume:  48.08 ml  ?Biopsy results:   ?          Gleason score: 3+ 3 = 6, 3 + 4 = 7 ?           # positive cores: 3/6 on right    1/6 on left ?           Location of cancer: mid gland, base ? ?Complications after biopsy: none ?Patient without significant LUTS.    ?Patient with erectile dysfunction.   ? ?The patient underwent low-dose rate brachytherapy on 03/17/2021.  A total of 69 seeds were implanted.  He was discharged with a Foley catheter in place. ?His Foley was removed on 03/25/2021 following a successful voiding trial.  Unfortunately, he developed urinary retention requiring placement of a Foley catheter on 03/26/2021. ?He was started on silodosin. ?His Foley catheter was removed on 04/04/2021 after a successful voiding trial. ? ?He returns today for follow-up.  He continues on silodosin.  He is voiding spontaneously.  He does have nocturia 3-4 times.  He also reports occasional dysuria.  No gross hematuria. ? ?Portions of the above documentation were copied from a prior visit for review purposes  only. ? ? ?Past Medical History:  ?Past Medical History:  ?Diagnosis Date  ? Diabetes mellitus type 2   ? GERD (gastroesophageal reflux disease)   ? History of COVID-19   ? jan/feb 2020  ? History of COVID-19 01/2020  ? mild symptoms all symptoms all symptoms resolved  ? History of gout   ? none in years  ? History of kidney stones   ? passed on won at least 15 yrs ago per pt on 03-15-2021  ? HTN (hypertension)   ? Hyperlipidemia   ? OsteoArthritis   ? Prostate cancer ([place])   ? ? ?Past Surgical History:  ?Past Surgical History:  ?Procedure Laterality Date  ? COLONOSCOPY N/A 07/21/2015  ? Procedure: COLONOSCOPY;  Surgeon: Rogene Houston, MD;  [facility];  Service: Endoscopy;  Laterality: N/A;  730  ? CYSTOSCOPY N/A 03/17/2021  ? Procedure: CYSTOSCOPY;  Surgeon: Cleon Gustin, MD;  Location: [facility];  Service: Urology;  Laterality: N/A;  ? left shoudler surgery    ? in high school result of football accident  ? RADIOACTIVE SEED IMPLANT N/A 03/17/2021  ? Procedure: RADIOACTIVE SEED IMPLANT/BRACHYTHERAPY IMPLANT;  Surgeon: Cleon Gustin, MD;  Location: [facility];  Service: Urology;  Laterality: N/A;  ?  right shoulder arthroplasty  1999  ? right shoulder exploration and implantation of antibiotic spacer 05-02-2010    ? TONSILLECTOMY  1963  ? and adenoids  ? vasectomy  1995  ? ? ?Allergies:  ?Allergies  ?Allergen Reactions  ? Penicillins   ?  Unspecified reaction as child  ? ? ?Family History:  ?History reviewed. No pertinent family history. ? ?Social History:  ?Social History  ? ?Tobacco Use  ? Smoking status: Former  ?  Packs/day: 1.00  ?  Years: 30.00  ?  Pack years: 30.00  ?  Types: Cigarettes  ?  Quit date: 01/03/2000  ?  Years since quitting: 21.3  ? Smokeless tobacco: Never  ?Vaping Use  ? Vaping Use: Never used  ?Substance Use Topics  ? Alcohol use: No  ? Drug use: No  ? ?ROS: ?Constitutional:  Negative for fever, chills, weight loss ?CV: Negative for chest  pain, previous MI, hypertension ?Respiratory:  Negative for shortness of breath, wheezing, sleep apnea, frequent cough ?GI:  Negative for nausea, vomiting, bloody stool, GERD ? ?Physical exam: ?BP (!) 171/85   Pulse 99  ?GENERAL APPEARANCE:  Well appearing, well developed, well nourished, NAD ?HEENT:  Atraumatic, normocephalic, oropharynx clear ?NECK:  Supple without lymphadenopathy or thyromegaly ?ABDOMEN:  Soft, non-tender, no masses ?EXTREMITIES:  Moves all extremities well, without clubbing, cyanosis, or edema ?NEUROLOGIC:  Alert and oriented x 3, normal gait, CN II-XII grossly intact ?MENTAL STATUS:  appropriate ?BACK:  Non-tender to palpation, No CVAT ?SKIN:  Warm, dry, and intact ? ? ?Results: ?U/A: 3-10 RBCs ? ?PVR: 75 mL ?

## 2021-05-18 DIAGNOSIS — M109 Gout, unspecified: Secondary | ICD-10-CM | POA: Diagnosis not present

## 2021-05-18 DIAGNOSIS — E782 Mixed hyperlipidemia: Secondary | ICD-10-CM | POA: Diagnosis not present

## 2021-05-18 DIAGNOSIS — E1165 Type 2 diabetes mellitus with hyperglycemia: Secondary | ICD-10-CM | POA: Diagnosis not present

## 2021-05-24 DIAGNOSIS — E1165 Type 2 diabetes mellitus with hyperglycemia: Secondary | ICD-10-CM | POA: Diagnosis not present

## 2021-05-24 DIAGNOSIS — K219 Gastro-esophageal reflux disease without esophagitis: Secondary | ICD-10-CM | POA: Diagnosis not present

## 2021-05-24 DIAGNOSIS — R339 Retention of urine, unspecified: Secondary | ICD-10-CM | POA: Diagnosis not present

## 2021-05-24 DIAGNOSIS — R809 Proteinuria, unspecified: Secondary | ICD-10-CM | POA: Diagnosis not present

## 2021-05-24 DIAGNOSIS — E782 Mixed hyperlipidemia: Secondary | ICD-10-CM | POA: Diagnosis not present

## 2021-05-24 DIAGNOSIS — R945 Abnormal results of liver function studies: Secondary | ICD-10-CM | POA: Diagnosis not present

## 2021-05-24 DIAGNOSIS — I10 Essential (primary) hypertension: Secondary | ICD-10-CM | POA: Diagnosis not present

## 2021-05-24 DIAGNOSIS — R197 Diarrhea, unspecified: Secondary | ICD-10-CM | POA: Diagnosis not present

## 2021-05-24 DIAGNOSIS — M109 Gout, unspecified: Secondary | ICD-10-CM | POA: Diagnosis not present

## 2021-06-09 NOTE — Progress Notes (Signed)
  Radiation Oncology         [phone]) [phone] ________________________________  Name: Willie Oneal. MRN: [id number]  Date: 04/27/2021  DOB: 1950-02-20  3D Planning Note   Prostate Brachytherapy Post-Implant Dosimetry  Diagnosis: 71 y.o. gentleman with Stage T1c adenocarcinoma of the prostate with Gleason score of 3+4, and PSA of 6.1.  Narrative: On a previous date, Willie Oneal. returned following prostate seed implantation for post implant planning. He underwent CT scan complex simulation to delineate the three-dimensional structures of the pelvis and demonstrate the radiation distribution.  Since that time, the seed localization, and complex isodose planning with dose volume histograms have now been completed.  Results:   Prostate Coverage - The dose of radiation delivered to the 90% or more of the prostate gland (D90) was 113.89% of the prescription dose. This exceeds our goal of greater than 90%. Rectal Sparing - The volume of rectal tissue receiving the prescription dose or higher was 0.37 cc. This falls under our thresholds tolerance of 1.0 cc.  Impression: The prostate seed implant appears to show adequate target coverage and appropriate rectal sparing.  Plan:  The patient will continue to follow with urology for ongoing PSA determinations. I would anticipate a high likelihood for local tumor control with minimal risk for rectal morbidity.  ________________________________  [name], M.D.

## 2021-06-30 ENCOUNTER — Ambulatory Visit (INDEPENDENT_AMBULATORY_CARE_PROVIDER_SITE_OTHER): Payer: 59 | Admitting: Urology

## 2021-06-30 ENCOUNTER — Ambulatory Visit: Payer: 59 | Admitting: Urology

## 2021-06-30 ENCOUNTER — Encounter: Payer: Self-pay | Admitting: Urology

## 2021-06-30 VITALS — BP 148/92 | HR 94

## 2021-06-30 DIAGNOSIS — Z8546 Personal history of malignant neoplasm of prostate: Secondary | ICD-10-CM

## 2021-06-30 DIAGNOSIS — Z87898 Personal history of other specified conditions: Secondary | ICD-10-CM

## 2021-06-30 DIAGNOSIS — R339 Retention of urine, unspecified: Secondary | ICD-10-CM

## 2021-06-30 DIAGNOSIS — C61 Malignant neoplasm of prostate: Secondary | ICD-10-CM

## 2021-06-30 LAB — URINALYSIS, ROUTINE W REFLEX MICROSCOPIC
Bilirubin, UA: NEGATIVE
Ketones, UA: NEGATIVE
Leukocytes,UA: NEGATIVE
Nitrite, UA: NEGATIVE
RBC, UA: NEGATIVE
Specific Gravity, UA: 1.01 (ref 1.005–1.030)
Urobilinogen, Ur: 0.2 mg/dL (ref 0.2–1.0)
pH, UA: 5 (ref 5.0–7.5)

## 2021-06-30 LAB — MICROSCOPIC EXAMINATION
Epithelial Cells (non renal): NONE SEEN /hpf (ref 0–10)
RBC, Urine: NONE SEEN /hpf (ref 0–2)
Renal Epithel, UA: NONE SEEN /hpf
WBC, UA: NONE SEEN /hpf (ref 0–5)

## 2021-06-30 LAB — BLADDER SCAN AMB NON-IMAGING: Scan Result: 34

## 2021-06-30 NOTE — Progress Notes (Deleted)
Assessment: 1. Prostate cancer ([place]); T1cNxMx; Gleason grade group 1 & 2; favorable intermediate risk; s/p LDR brachytherapy 03/17/21   2. History of urinary retention     Plan: Continue silodosin 8 mg daily. PSA today Return to office in 3 months  Chief Complaint:  No chief complaint on file.  History of Present Illness:  Willie Oneal. is a 71 y.o. year old male who is seen for further evaluation of prostate cancer.   PSA results: 7/22 5.8 10/22 6.1 He is not aware of any prior history of elevated PSA.  No history of prostatitis or UTIs.  No family history of prostate cancer.  His only urinary symptom is nocturia x 2.  No dysuria or gross hematuria. IPSS = 2.  He does have a remote history of a kidney stone 20 years ago.  He returns today following his transrectal ultrasound and biopsy of the prostate on 12/09/20. PSA: 5.8 ng/ml TRUS volume:  48.08 ml  Biopsy results:             Gleason score: 3+ 3 = 6, 3 + 4 = 7            # positive cores: 3/6 on right    1/6 on left            Location of cancer: mid gland, base  Complications after biopsy: none Patient without significant LUTS.    Patient with erectile dysfunction.    The patient underwent low-dose rate brachytherapy on 03/17/2021.  A total of 69 seeds were implanted.  He was discharged with a Foley catheter in place. His Foley was removed on 03/25/2021 following a successful voiding trial.  Unfortunately, he developed urinary retention requiring placement of a Foley catheter on 03/26/2021. He was started on silodosin. His Foley catheter was removed on 04/04/2021 after a successful voiding trial. At his visit on 04/29/21, he continued on silodosin.  He wasvoiding spontaneously.  He reported nocturia 3-4 times and occasional dysuria.  No gross hematuria.  Portions of the above documentation were copied from a prior visit for review purposes only.   Past Medical History:  Past Medical History:  Diagnosis Date    Diabetes mellitus type 2    GERD (gastroesophageal reflux disease)    History of COVID-19    jan/feb 2020   History of COVID-19 01/2020   mild symptoms all symptoms all symptoms resolved   History of gout    none in years   History of kidney stones    passed on won at least 15 yrs ago per pt on 03-15-2021   HTN (hypertension)    Hyperlipidemia    OsteoArthritis    Prostate cancer [facility])     Past Surgical History:  Past Surgical History:  Procedure Laterality Date   COLONOSCOPY N/A 07/21/2015   Procedure: COLONOSCOPY;  Surgeon: Rogene Houston, MD;  [facility];  Service: Endoscopy;  Laterality: N/A;  730   CYSTOSCOPY N/A 03/17/2021   Procedure: CYSTOSCOPY;  Surgeon: Cleon Gustin, MD;  Location: [facility];  Service: Urology;  Laterality: N/A;   left shoudler surgery     in high school result of football accident   [place] N/A 03/17/2021   Procedure: RADIOACTIVE SEED IMPLANT/BRACHYTHERAPY IMPLANT;  Surgeon: Cleon Gustin, MD;  Location: [facility];  Service: Urology;  Laterality: N/A;   right shoulder arthroplasty  1999   right shoulder exploration and implantation of antibiotic spacer  05-02-2010     TONSILLECTOMY  1963   and adenoids   vasectomy  1995    Allergies:  Allergies  Allergen Reactions   Penicillins     Unspecified reaction as child    Family History:  No family history on file.  Social History:  Social History   Tobacco Use   Smoking status: Former    Packs/day: 1.00    Years: 30.00    Total pack years: 30.00    Types: Cigarettes    Quit date: 01/03/2000    Years since quitting: 21.5   Smokeless tobacco: Never  Vaping Use   Vaping Use: Never used  Substance Use Topics   Alcohol use: No   Drug use: No   ROS: Constitutional:  Negative for fever, chills, weight loss CV: Negative for chest pain, previous MI, hypertension Respiratory:  Negative for shortness of breath, wheezing,  sleep apnea, frequent cough GI:  Negative for nausea, vomiting, bloody stool, GERD  Physical exam: There were no vitals taken for this visit. GENERAL APPEARANCE:  Well appearing, well developed, well nourished, NAD HEENT:  Atraumatic, normocephalic, oropharynx clear NECK:  Supple without lymphadenopathy or thyromegaly ABDOMEN:  Soft, non-tender, no masses EXTREMITIES:  Moves all extremities well, without clubbing, cyanosis, or edema NEUROLOGIC:  Alert and oriented x 3, normal gait, CN II-XII grossly intact MENTAL STATUS:  appropriate BACK:  Non-tender to palpation, No CVAT SKIN:  Warm, dry, and intact   Results: U/A:

## 2021-06-30 NOTE — Progress Notes (Signed)
Assessment: 1. Prostate cancer ([place]); T1cNxMx; Gleason grade group 1 & 2; favorable intermediate risk; s/p LDR brachytherapy 03/17/21   2. History of urinary retention    Plan: Continue silodosin 8 mg daily. PSA today Return to office in 3 months  Chief Complaint:  Chief Complaint  Patient presents with   Prostate Cancer   History of Present Illness:  Willie Oneal. is a 71 y.o. year old male who is seen for further evaluation of prostate cancer.   PSA results: 7/22 5.8 10/22 6.1 No history of prostatitis or UTIs.  No family history of prostate cancer.  His only urinary symptom was nocturia x 2.  No dysuria or gross hematuria. IPSS = 2.  He does have a remote history of a kidney stone 20 years ago.  He is s/p transrectal ultrasound and biopsy of the prostate on 12/09/20. PSA: 5.8 ng/ml TRUS volume:  48.08 ml  Biopsy results:             Gleason score: 3+ 3 = 6, 3 + 4 = 7            # positive cores: 3/6 on right    1/6 on left            Location of cancer: mid gland, base  Complications after biopsy: none  The patient underwent low-dose rate brachytherapy on 03/17/2021.  A total of 69 seeds were implanted.  He was discharged with a Foley catheter in place. His Foley was removed on 03/25/2021 following a successful voiding trial.  Unfortunately, he developed urinary retention requiring placement of a Foley catheter on 03/26/2021. He was started on silodosin. His Foley catheter was removed on 04/04/2021 after a successful voiding trial. At his visit on 04/29/21, he continued on silodosin.  He was voiding spontaneously.  He reported nocturia 3-4 times and occasional dysuria.  No gross hematuria.  He returns today for follow-up.  He continues on silodosin.  He reports continued improvement in his lower urinary tract symptoms.  He is voiding with a good stream and feels like he is emptying well.  He does have some frequency and nocturia.  No dysuria or gross  hematuria.  Portions of the above documentation were copied from a prior visit for review purposes only.   Past Medical History:  Past Medical History:  Diagnosis Date   Diabetes mellitus type 2    GERD (gastroesophageal reflux disease)    History of COVID-19    jan/feb 2020   History of COVID-19 01/2020   mild symptoms all symptoms all symptoms resolved   History of gout    none in years   History of kidney stones    passed on won at least 15 yrs ago per pt on 03-15-2021   HTN (hypertension)    Hyperlipidemia    OsteoArthritis    Prostate cancer [facility])     Past Surgical History:  Past Surgical History:  Procedure Laterality Date   COLONOSCOPY N/A 07/21/2015   Procedure: COLONOSCOPY;  Surgeon: Rogene Houston, MD;  [facility];  Service: Endoscopy;  Laterality: N/A;  730   CYSTOSCOPY N/A 03/17/2021   Procedure: CYSTOSCOPY;  Surgeon: Cleon Gustin, MD;  Location[facility];  Service: Urology;  Laterality: N/A;   left shoudler surgery     in high school result of football accident   [place] N/A 03/17/2021   Procedure: RADIOACTIVE SEED IMPLANT/BRACHYTHERAPY IMPLANT;  Surgeon: Cleon Gustin,  MD;  Location: [place];  Service: Urology;  Laterality: N/A;   right shoulder arthroplasty  1999   right shoulder exploration and implantation of antibiotic spacer 05-02-2010     TONSILLECTOMY  1963   and adenoids   vasectomy  1995    Allergies:  Allergies  Allergen Reactions   Penicillins     Unspecified reaction as child    Family History:  No family history on file.  Social History:  Social History   Tobacco Use   Smoking status: Former    Packs/day: 1.00    Years: 30.00    Total pack years: 30.00    Types: Cigarettes    Quit date: 01/03/2000    Years since quitting: 21.5   Smokeless tobacco: Never  Vaping Use   Vaping Use: Never used  Substance Use Topics   Alcohol use: No   Drug use: No    ROS: Constitutional:  Negative for fever, chills, weight loss CV: Negative for chest pain, previous MI, hypertension Respiratory:  Negative for shortness of breath, wheezing, sleep apnea, frequent cough GI:  Negative for nausea, vomiting, bloody stool, GERD  Physical exam: BP (!) 148/92   Pulse 94  GENERAL APPEARANCE:  Well appearing, well developed, well nourished, NAD HEENT:  Atraumatic, normocephalic, oropharynx clear NECK:  Supple without lymphadenopathy or thyromegaly ABDOMEN:  Soft, non-tender, no masses EXTREMITIES:  Moves all extremities well, without clubbing, cyanosis, or edema NEUROLOGIC:  Alert and oriented x 3, normal gait, CN II-XII grossly intact MENTAL STATUS:  appropriate BACK:  Non-tender to palpation, No CVAT SKIN:  Warm, dry, and intact   Results: U/A: 3+ glucose, 2+ protein  PVR = 34 ml

## 2021-07-01 ENCOUNTER — Encounter: Payer: Self-pay | Admitting: Urology

## 2021-07-01 LAB — PSA: Prostate Specific Ag, Serum: 0.7 ng/mL (ref 0.0–4.0)

## 2021-07-13 DIAGNOSIS — E119 Type 2 diabetes mellitus without complications: Secondary | ICD-10-CM | POA: Diagnosis not present

## 2021-08-01 ENCOUNTER — Encounter (INDEPENDENT_AMBULATORY_CARE_PROVIDER_SITE_OTHER): Payer: Self-pay | Admitting: *Deleted

## 2021-08-25 DIAGNOSIS — E1165 Type 2 diabetes mellitus with hyperglycemia: Secondary | ICD-10-CM | POA: Diagnosis not present

## 2021-08-25 DIAGNOSIS — E782 Mixed hyperlipidemia: Secondary | ICD-10-CM | POA: Diagnosis not present

## 2021-08-25 DIAGNOSIS — M109 Gout, unspecified: Secondary | ICD-10-CM | POA: Diagnosis not present

## 2021-09-01 DIAGNOSIS — E782 Mixed hyperlipidemia: Secondary | ICD-10-CM | POA: Diagnosis not present

## 2021-09-01 DIAGNOSIS — K219 Gastro-esophageal reflux disease without esophagitis: Secondary | ICD-10-CM | POA: Diagnosis not present

## 2021-09-01 DIAGNOSIS — M109 Gout, unspecified: Secondary | ICD-10-CM | POA: Diagnosis not present

## 2021-09-01 DIAGNOSIS — R809 Proteinuria, unspecified: Secondary | ICD-10-CM | POA: Diagnosis not present

## 2021-09-01 DIAGNOSIS — R339 Retention of urine, unspecified: Secondary | ICD-10-CM | POA: Diagnosis not present

## 2021-09-01 DIAGNOSIS — R197 Diarrhea, unspecified: Secondary | ICD-10-CM | POA: Diagnosis not present

## 2021-09-01 DIAGNOSIS — I10 Essential (primary) hypertension: Secondary | ICD-10-CM | POA: Diagnosis not present

## 2021-09-01 DIAGNOSIS — R945 Abnormal results of liver function studies: Secondary | ICD-10-CM | POA: Diagnosis not present

## 2021-09-01 DIAGNOSIS — E1165 Type 2 diabetes mellitus with hyperglycemia: Secondary | ICD-10-CM | POA: Diagnosis not present

## 2021-09-28 ENCOUNTER — Ambulatory Visit (INDEPENDENT_AMBULATORY_CARE_PROVIDER_SITE_OTHER): Payer: Medicare Other | Admitting: Urology

## 2021-09-28 ENCOUNTER — Encounter: Payer: Self-pay | Admitting: Urology

## 2021-09-28 VITALS — BP 132/61 | HR 83 | Ht 70.5 in | Wt 200.0 lb

## 2021-09-28 DIAGNOSIS — R339 Retention of urine, unspecified: Secondary | ICD-10-CM | POA: Diagnosis not present

## 2021-09-28 DIAGNOSIS — C61 Malignant neoplasm of prostate: Secondary | ICD-10-CM

## 2021-09-28 DIAGNOSIS — Z87898 Personal history of other specified conditions: Secondary | ICD-10-CM

## 2021-09-28 LAB — MICROSCOPIC EXAMINATION: Bacteria, UA: NONE SEEN

## 2021-09-28 LAB — URINALYSIS, ROUTINE W REFLEX MICROSCOPIC
Bilirubin, UA: NEGATIVE
Ketones, UA: NEGATIVE
Leukocytes,UA: NEGATIVE
Nitrite, UA: NEGATIVE
Specific Gravity, UA: 1.03 (ref 1.005–1.030)
Urobilinogen, Ur: 0.2 mg/dL (ref 0.2–1.0)
pH, UA: 5.5 (ref 5.0–7.5)

## 2021-09-28 NOTE — Progress Notes (Signed)
Assessment: 1. Prostate cancer ([place]); T1cNxMx; Gleason grade group 1 & 2; favorable intermediate risk; s/p LDR brachytherapy 03/17/21   2. History of urinary retention     Plan: PSA today Return to office in 3 months  Chief Complaint:  Chief Complaint  Patient presents with   Prostate Cancer   History of Present Illness:  Willie Oneal. is a 71 y.o. year old male who is seen for further evaluation of prostate cancer.   PSA results: 7/22 5.8 10/22 6.1 No history of prostatitis or UTIs.  No family history of prostate cancer.  His only urinary symptom was nocturia x 2.  No dysuria or gross hematuria. IPSS = 2.  He does have a remote history of a kidney stone 20 years ago.  He underwent transrectal ultrasound and biopsy of the prostate on 12/09/20. PSA: 5.8 ng/ml TRUS volume:  48.08 ml  Biopsy results:             Gleason score: 3+ 3 = 6, 3 + 4 = 7            # positive cores: 3/6 on right    1/6 on left            Location of cancer: mid gland, base  Complications after biopsy: none  The patient underwent low-dose rate brachytherapy on 03/17/2021.  A total of 69 seeds were implanted.  He was discharged with a Foley catheter in place. His Foley was removed on 03/25/2021 following a successful voiding trial.  Unfortunately, he developed urinary retention requiring placement of a Foley catheter on 03/26/2021. He was started on silodosin. His Foley catheter was removed on 04/04/2021 after a successful voiding trial. At his visit on 04/29/21, he continued on silodosin.  He was voiding spontaneously.  He reported nocturia 3-4 times and occasional dysuria.  No gross hematuria.  Post treatment PSA: 6/23 0.7  He presents today for scheduled follow-up.  He discontinued the silodosin several months ago.  His urinary symptoms are stable.  He voids with a good stream and feels like he empties completely.  He continues to have frequency and nocturia.  No dysuria or gross hematuria. IPSS =  9 today.  Portions of the above documentation were copied from a prior visit for review purposes only.   Past Medical History:  Past Medical History:  Diagnosis Date   Diabetes mellitus type 2    GERD (gastroesophageal reflux disease)    History of COVID-19    jan/feb 2020   History of COVID-19 01/2020   mild symptoms all symptoms all symptoms resolved   History of gout    none in years   History of kidney stones    passed on won at least 15 yrs ago per pt on 03-15-2021   HTN (hypertension)    Hyperlipidemia    OsteoArthritis    Prostate cancer [facility])     Past Surgical History:  Past Surgical History:  Procedure Laterality Date   COLONOSCOPY N/A 07/21/2015   Procedure: COLONOSCOPY;  Surgeon: Rogene Houston, MD;  [facility];  Service: Endoscopy;  Laterality: N/A;  730   CYSTOSCOPY N/A 03/17/2021   Procedure: CYSTOSCOPY;  Surgeon: Cleon Gustin, MD;  Location[facility];  Service: Urology;  Laterality: N/A;   left shoudler surgery     in high school result of football accident   [place] N/A 03/17/2021   Procedure: RADIOACTIVE SEED IMPLANT/BRACHYTHERAPY IMPLANT;  Surgeon: Alyson Ingles,  Candee Furbish, MD;  Location: [facility];  Service: Urology;  Laterality: N/A;   right shoulder arthroplasty  1999   right shoulder exploration and implantation of antibiotic spacer 05-02-2010     TONSILLECTOMY  1963   and adenoids   vasectomy  1995    Allergies:  Allergies  Allergen Reactions   Penicillins     Unspecified reaction as child    Family History:  No family history on file.  Social History:  Social History   Tobacco Use   Smoking status: Former    Packs/day: 1.00    Years: 30.00    Total pack years: 30.00    Types: Cigarettes    Quit date: 01/03/2000    Years since quitting: 21.7   Smokeless tobacco: Never  Vaping Use   Vaping Use: Never used  Substance Use Topics   Alcohol use: No   Drug use: No    ROS: Constitutional:  Negative for fever, chills, weight loss CV: Negative for chest pain, previous MI, hypertension Respiratory:  Negative for shortness of breath, wheezing, sleep apnea, frequent cough GI:  Negative for nausea, vomiting, bloody stool, GERD  Physical exam: Ht 5' 10.5" (1.791 m)   Wt 200 lb (90.7 kg)   BMI 28.29 kg/m  GENERAL APPEARANCE:  Well appearing, well developed, well nourished, NAD HEENT:  Atraumatic, normocephalic, oropharynx clear NECK:  Supple without lymphadenopathy or thyromegaly ABDOMEN:  Soft, non-tender, no masses EXTREMITIES:  Moves all extremities well, without clubbing, cyanosis, or edema NEUROLOGIC:  Alert and oriented x 3, normal gait, CN II-XII grossly intact MENTAL STATUS:  appropriate BACK:  Non-tender to palpation, No CVAT SKIN:  Warm, dry, and intact   Results: U/A:0-5 WBC, 0-2 RBC, no bacteria, 2+ glucose, 3+ protein

## 2021-09-30 LAB — PSA: Prostate Specific Ag, Serum: 0.4 ng/mL (ref 0.0–4.0)

## 2021-10-03 ENCOUNTER — Telehealth: Payer: Self-pay

## 2021-10-03 NOTE — Telephone Encounter (Signed)
-----   Message from Primus Bravo, MD sent at 09/30/2021  9:06 AM EDT ----- Please notify patient that his PSA continues to decrease following treatment.

## 2021-10-03 NOTE — Telephone Encounter (Signed)
Patient aware of results, he requested his next f/u be moved out a week.  Rescheduled apt as requested and reminder letter sent.

## 2021-10-13 ENCOUNTER — Encounter (INDEPENDENT_AMBULATORY_CARE_PROVIDER_SITE_OTHER): Payer: Self-pay | Admitting: Gastroenterology

## 2021-10-24 ENCOUNTER — Ambulatory Visit (INDEPENDENT_AMBULATORY_CARE_PROVIDER_SITE_OTHER): Payer: 59 | Admitting: Gastroenterology

## 2021-10-26 ENCOUNTER — Encounter: Payer: Self-pay | Admitting: Internal Medicine

## 2021-10-26 ENCOUNTER — Ambulatory Visit (INDEPENDENT_AMBULATORY_CARE_PROVIDER_SITE_OTHER): Payer: Medicare Other | Admitting: Internal Medicine

## 2021-10-26 VITALS — BP 139/83 | HR 78 | Temp 97.7°F | Ht 70.5 in | Wt 203.5 lb

## 2021-10-26 DIAGNOSIS — K219 Gastro-esophageal reflux disease without esophagitis: Secondary | ICD-10-CM

## 2021-10-26 DIAGNOSIS — R197 Diarrhea, unspecified: Secondary | ICD-10-CM

## 2021-10-26 DIAGNOSIS — I499 Cardiac arrhythmia, unspecified: Secondary | ICD-10-CM

## 2021-10-26 NOTE — Progress Notes (Signed)
Primary Care Physician:  Celene Squibb, MD Primary Gastroenterologist:  Dr. Abbey Chatters  Chief Complaint  Patient presents with   Diarrhea    Patient here today with complaints of having issues with diarrhea on going for a couple of years. He says he usually has a bm after eating breakfast. Patient says he is not taking any current medications to help with diarrhea. Denies any other current gi issues.He does have some issues at times with gerd and he is taking omeprazole 20 mg per day.    HPI:   Willie Oneal. is a 71 y.o. male who presents to clinic today by referral from his PCP Dr. Nevada Crane for evaluation.  Patient notes chronic diarrhea for 2 to 3 years.  States this usually occurs after breakfast he will have 2-3 loose bowel movements.  Rarely has normal bowel movement.  States he has diarrhea 75% of the time.  No melena hematochezia.  No associated abdominal pain.  Does chronically take metformin 1000 mg twice daily.  States he recently decreased this to once a day to see and notes mild improvement.  Gallbladder in situ.  Last colonoscopy 2017 showed a few small benign polyps, not adenomatous on pathology, 10-year recall.  Denies any family history of colon cancer.  Does have chronic GERD for which she takes omeprazole 20 mg daily.  States this is relatively well controlled though occasionally will have breakthrough symptoms.  Does note eating a lot of spicy foods.  Also notes eating late at night on occasion.  No dysphagia odynophagia.  No epigastric or chest pain.  Past Medical History:  Diagnosis Date   Diabetes mellitus type 2    GERD (gastroesophageal reflux disease)    History of COVID-19    jan/feb 2020   History of COVID-19 01/2020   mild symptoms all symptoms all symptoms resolved   History of gout    none in years   History of kidney stones    passed on won at least 15 yrs ago per pt on 03-15-2021   HTN (hypertension)    Hyperlipidemia    OsteoArthritis    Prostate cancer  [facility])     Past Surgical History:  Procedure Laterality Date   COLONOSCOPY N/A 07/21/2015   Procedure: COLONOSCOPY;  Surgeon: Rogene Houston, MD;  [facility];  Service: Endoscopy;  Laterality: N/A;  730   CYSTOSCOPY N/A 03/17/2021   Procedure: CYSTOSCOPY;  Surgeon: Cleon Gustin, MD;  Location: [facility];  Service: Urology;  Laterality: N/A;   left shoudler surgery     in high school result of football accident   [place] N/A 03/17/2021   Procedure: RADIOACTIVE SEED IMPLANT/BRACHYTHERAPY IMPLANT;  Surgeon: Cleon Gustin, MD;  Location: [facility];  Service: Urology;  Laterality: N/A;   right shoulder arthroplasty  1999   right shoulder exploration and implantation of antibiotic spacer 05-02-2010     TONSILLECTOMY  1963   and adenoids   vasectomy  1995    Current Outpatient Medications  Medication Sig Dispense Refill   allopurinol (ZYLOPRIM) 100 MG tablet Take 100 mg by mouth daily.     atenolol (TENORMIN) 50 MG tablet Take 50 mg by mouth daily.     glipiZIDE (GLUCOTROL XL) 10 MG 24 hr tablet Take 10 mg by mouth daily with breakfast.     lisinopril (PRINIVIL,ZESTRIL) 10 MG tablet Take 10 mg by mouth daily.     metFORMIN (GLUCOPHAGE-XR)  500 MG 24 hr tablet Take 1,000 mg by mouth 2 (two) times daily.     omeprazole (PRILOSEC) 20 MG capsule Take 20 mg by mouth at bedtime.     simvastatin (ZOCOR) 20 MG tablet Take 20 mg by mouth daily.     No current facility-administered medications for this visit.    Allergies as of 10/26/2021 - Review Complete 10/26/2021  Allergen Reaction Noted   Penicillins  03/14/2021    History reviewed. No pertinent family history.  Social History   Socioeconomic History   Marital status: Married    Spouse name: Not on file   Number of children: Not on file   Years of education: Not on file   Highest education level: Not on file  Occupational History   Not on file  Tobacco Use    Smoking status: Former    Packs/day: 1.00    Years: 30.00    Total pack years: 30.00    Types: Cigarettes    Quit date: 01/03/2000    Years since quitting: 21.8   Smokeless tobacco: Never  Vaping Use   Vaping Use: Never used  Substance and Sexual Activity   Alcohol use: No   Drug use: No   Sexual activity: Not on file  Other Topics Concern   Not on file  Social History Narrative   Not on file   Social Determinants of Health   Financial Resource Strain: Not on file  Food Insecurity: Not on file  Transportation Needs: Not on file  Physical Activity: Not on file  Stress: Not on file  Social Connections: Not on file  Intimate Partner Violence: Not on file    Subjective: Review of Systems  Constitutional:  Negative for chills and fever.  HENT:  Negative for congestion and hearing loss.   Eyes:  Negative for blurred vision and double vision.  Respiratory:  Negative for cough and shortness of breath.   Cardiovascular:  Negative for chest pain and palpitations.  Gastrointestinal:  Positive for diarrhea and heartburn. Negative for abdominal pain, blood in stool, constipation, melena and vomiting.  Genitourinary:  Negative for dysuria and urgency.  Musculoskeletal:  Negative for joint pain and myalgias.  Skin:  Negative for itching and rash.  Neurological:  Negative for dizziness and headaches.  Psychiatric/Behavioral:  Negative for depression. The patient is not nervous/anxious.        Objective: BP 139/83 (BP Location: Left Arm, Patient Position: Sitting, Cuff Size: Large)   Pulse 78   Temp 97.7 F (36.5 C) (Oral)   Ht 5' 10.5" (1.791 m)   Wt 203 lb 8 oz (92.3 kg)   BMI 28.79 kg/m  Physical Exam Constitutional:      Appearance: Normal appearance.  HENT:     Head: Normocephalic and atraumatic.  Eyes:     Extraocular Movements: Extraocular movements intact.     Conjunctiva/sclera: Conjunctivae normal.  Cardiovascular:     Rate and Rhythm: Normal rate. Rhythm  irregular.  Pulmonary:     Effort: Pulmonary effort is normal.     Breath sounds: Normal breath sounds.  Abdominal:     General: Bowel sounds are normal.     Palpations: Abdomen is soft.  Musculoskeletal:        General: Normal range of motion.     Cervical back: Normal range of motion and neck supple.  Skin:    General: Skin is warm.  Neurological:     General: No focal deficit present.  Mental Status: He is alert and oriented to person, place, and time.  Psychiatric:        Mood and Affect: Mood normal.        Behavior: Behavior normal.      Assessment/plan:  1.  Diarrhea-chronic, possibly medication induced from metformin.  Possible component of IBS as well.  No alarm symptoms today to warrant further in evaluation with colonoscopy.  Patient recommended to take Imodium 1 tablet 20 to 30 minutes before breakfast daily.  If this leads to constipation, he can break tablet in half or take Imodium every 2 to 3 days.  Also recommended he start taking over-the-counter Benefiber.  2.  Chronic GERD-relatively well-controlled on omeprazole daily.  Counseled he can take a second dose if he is having breakthrough symptoms related to what he eats.  Does note eating late at night.  Also notes eating a lot of spicy food.  Home practices to reduce reflux symptoms printed out for patient today.  3.  Irregular heartbeat-recommended follow-up with primary care to further discuss.  Offered referral to cardiology today he states he would like to hold off until he speaks with his PCP first.  Follow-up with GI in 3 to 4 months.  Thank you Dr. Nevada Crane for the kind referral.  10/26/2021 9:08 AM   Disclaimer: This note was dictated with voice recognition software. Similar sounding words can inadvertently be transcribed and may not be corrected upon review.

## 2021-10-26 NOTE — Patient Instructions (Signed)
For your chronic diarrhea, I want you to start taking Imodium 1 tablet 20 to 30 minutes before breakfast.  If the this leads to constipation, you can break tablet in half or take 1 every 2 to 3 days.  Also want you to start taking over-the-counter Benefiber which will help open up your stool.  Continue omeprazole daily for your chronic reflux.  You can take a second dose if you are having breakthrough symptoms.  Follow-up with GI in 3 to 4 months.  It was very nice meeting you today.  Dr. Abbey Chatters  Lifestyle and home remedies TO MANAGE REFLUX/HEARTBURN    You may eliminate or reduce the frequency of heartburn by making the following lifestyle changes:   Control your weight. Being overweight is a major risk factor for heartburn and GERD. Excess pounds put pressure on your abdomen, pushing up your stomach and causing acid to back up into your esophagus.    Eat smaller meals. 4 TO 6 MEALS A DAY. This reduces pressure on the lower esophageal sphincter, helping to prevent the valve from opening and acid from washing back into your esophagus.     Loosen your belt. Clothes that fit tightly around your waist put pressure on your abdomen and the lower esophageal sphincter.     Eliminate heartburn triggers. Everyone has specific triggers. Common triggers such as fatty or fried foods, spicy food, tomato sauce, carbonated beverages, alcohol, chocolate, mint, garlic, onion, caffeine and nicotine may make heartburn worse.    Avoid stooping or bending. Tying your shoes is OK. Bending over for longer periods to weed your garden isn't, especially soon after eating.    Don't lie down after a meal. Wait at least three to four hours after eating before going to bed, and don't lie down right after eating.

## 2021-12-16 DIAGNOSIS — E1165 Type 2 diabetes mellitus with hyperglycemia: Secondary | ICD-10-CM | POA: Diagnosis not present

## 2021-12-16 DIAGNOSIS — E782 Mixed hyperlipidemia: Secondary | ICD-10-CM | POA: Diagnosis not present

## 2021-12-23 DIAGNOSIS — I10 Essential (primary) hypertension: Secondary | ICD-10-CM | POA: Diagnosis not present

## 2021-12-23 DIAGNOSIS — R945 Abnormal results of liver function studies: Secondary | ICD-10-CM | POA: Diagnosis not present

## 2021-12-23 DIAGNOSIS — Z0001 Encounter for general adult medical examination with abnormal findings: Secondary | ICD-10-CM | POA: Diagnosis not present

## 2021-12-23 DIAGNOSIS — M109 Gout, unspecified: Secondary | ICD-10-CM | POA: Diagnosis not present

## 2021-12-23 DIAGNOSIS — E1169 Type 2 diabetes mellitus with other specified complication: Secondary | ICD-10-CM | POA: Diagnosis not present

## 2021-12-23 DIAGNOSIS — R002 Palpitations: Secondary | ICD-10-CM | POA: Diagnosis not present

## 2021-12-23 DIAGNOSIS — K219 Gastro-esophageal reflux disease without esophagitis: Secondary | ICD-10-CM | POA: Diagnosis not present

## 2021-12-23 DIAGNOSIS — R809 Proteinuria, unspecified: Secondary | ICD-10-CM | POA: Diagnosis not present

## 2021-12-23 DIAGNOSIS — E782 Mixed hyperlipidemia: Secondary | ICD-10-CM | POA: Diagnosis not present

## 2021-12-23 DIAGNOSIS — R197 Diarrhea, unspecified: Secondary | ICD-10-CM | POA: Diagnosis not present

## 2021-12-28 ENCOUNTER — Ambulatory Visit: Payer: 59 | Admitting: Urology

## 2022-01-06 ENCOUNTER — Ambulatory Visit: Payer: 59 | Admitting: Urology

## 2022-01-06 NOTE — Progress Notes (Deleted)
Assessment: 1. Prostate cancer ([place]); [id number]; Gleason grade group 1 & 2; favorable intermediate risk; s/p LDR brachytherapy 03/17/21   2. History of urinary retention     Plan: PSA today Return to office in 3 months  Chief Complaint:  No chief complaint on file.  History of Present Illness:  Willie Oneal. is a 72 y.o. year old male who is seen for further evaluation of favorable intermediate risk prostate cancer.   PSA results: 7/22 5.8 10/22 6.1 No history of prostatitis or UTIs.  No family history of prostate cancer.  His only urinary symptom was nocturia x 2.  No dysuria or gross hematuria. IPSS = 2.  He does have a remote history of a kidney stone 20 years ago.  He underwent transrectal ultrasound and biopsy of the prostate on 12/09/20. PSA: 5.8 ng/ml TRUS volume:  48.08 ml  Biopsy results:             Gleason score: 3+ 3 = 6, 3 + 4 = 7            # positive cores: 3/6 on right    1/6 on left            Location of cancer: mid gland, base  Complications after biopsy: none  The patient underwent low-dose rate brachytherapy on 03/17/2021.  A total of 69 seeds were implanted.  He was discharged with a Foley catheter in place. His Foley was removed on 03/25/2021 following a successful voiding trial.  Unfortunately, he developed urinary retention requiring placement of a Foley catheter on 03/26/2021. He was started on silodosin. His Foley catheter was removed on 04/04/2021 after a successful voiding trial. At his visit on 04/29/21, he continued on silodosin.  He was voiding spontaneously.  He reported nocturia 3-4 times and occasional dysuria.  No gross hematuria. At his visit in 9/23, he had discontinued these orders.  His urinary symptoms were stable.  He was voiding with a good stream and felt like he was emptying completely.  He continued with frequency and nocturia.  No dysuria or gross hematuria. IPSS = 9.  Post treatment PSA: 6/23 0.7 9/23 0.4   Portions of the  above documentation were copied from a prior visit for review purposes only.   Past Medical History:  Past Medical History:  Diagnosis Date   Diabetes mellitus type 2    GERD (gastroesophageal reflux disease)    History of COVID-19    jan/feb 2020   History of COVID-19 01/2020   mild symptoms all symptoms all symptoms resolved   History of gout    none in years   History of kidney stones    passed on won at least 15 yrs ago per pt on 03-15-2021   HTN (hypertension)    Hyperlipidemia    OsteoArthritis    Prostate cancer [facility])     Past Surgical History:  Past Surgical History:  Procedure Laterality Date   COLONOSCOPY N/A 07/21/2015   Procedure: COLONOSCOPY;  Surgeon: Rogene Houston, MD;  [facility];  Service: Endoscopy;  Laterality: N/A;  730   CYSTOSCOPY N/A 03/17/2021   Procedure: CYSTOSCOPY;  Surgeon: Cleon Gustin, MD;  Location: [facility];  Service: Urology;  Laterality: N/A;   left shoudler surgery     in high school result of football accident   [place] N/A 03/17/2021   Procedure: RADIOACTIVE SEED IMPLANT/BRACHYTHERAPY IMPLANT;  Surgeon: Cleon Gustin, MD;  Location:  [facility];  Service: Urology;  Laterality: N/A;   right shoulder arthroplasty  1999   right shoulder exploration and implantation of antibiotic spacer 05-02-2010     TONSILLECTOMY  1963   and adenoids   vasectomy  1995    Allergies:  Allergies  Allergen Reactions   Penicillins     Unspecified reaction as child    Family History:  No family history on file.  Social History:  Social History   Tobacco Use   Smoking status: Former    Packs/day: 1.00    Years: 30.00    Total pack years: 30.00    Types: Cigarettes    Quit date: 01/03/2000    Years since quitting: 22.0   Smokeless tobacco: Never  Vaping Use   Vaping Use: Never used  Substance Use Topics   Alcohol use: No   Drug use: No   ROS: Constitutional:  Negative  for fever, chills, weight loss CV: Negative for chest pain, previous MI, hypertension Respiratory:  Negative for shortness of breath, wheezing, sleep apnea, frequent cough GI:  Negative for nausea, vomiting, bloody stool, GERD  Physical exam: There were no vitals taken for this visit. GENERAL APPEARANCE:  Well appearing, well developed, well nourished, NAD HEENT:  Atraumatic, normocephalic, oropharynx clear NECK:  Supple without lymphadenopathy or thyromegaly ABDOMEN:  Soft, non-tender, no masses EXTREMITIES:  Moves all extremities well, without clubbing, cyanosis, or edema NEUROLOGIC:  Alert and oriented x 3, normal gait, CN II-XII grossly intact MENTAL STATUS:  appropriate BACK:  Non-tender to palpation, No CVAT SKIN:  Warm, dry, and intact   Results: U/A dipstick:

## 2022-01-19 ENCOUNTER — Ambulatory Visit: Payer: Medicare Other | Attending: Internal Medicine | Admitting: Internal Medicine

## 2022-01-19 ENCOUNTER — Ambulatory Visit (INDEPENDENT_AMBULATORY_CARE_PROVIDER_SITE_OTHER): Payer: Medicare Other

## 2022-01-19 ENCOUNTER — Encounter: Payer: Self-pay | Admitting: Internal Medicine

## 2022-01-19 VITALS — BP 149/75 | HR 88 | Ht 70.0 in | Wt 200.0 lb

## 2022-01-19 DIAGNOSIS — Z136 Encounter for screening for cardiovascular disorders: Secondary | ICD-10-CM

## 2022-01-19 DIAGNOSIS — R002 Palpitations: Secondary | ICD-10-CM | POA: Diagnosis not present

## 2022-01-19 DIAGNOSIS — E7849 Other hyperlipidemia: Secondary | ICD-10-CM | POA: Diagnosis not present

## 2022-01-19 DIAGNOSIS — E785 Hyperlipidemia, unspecified: Secondary | ICD-10-CM | POA: Insufficient documentation

## 2022-01-19 NOTE — Patient Instructions (Addendum)
Medication Instructions:  Your physician has recommended you make the following change in your medication:  -Discontinue Aspirin  Labwork: None  Testing/Procedures: Zio  Monitor XT- 7 days  Your physician has requested that you have cardiac CT. Cardiac computed tomography (CT) is a painless test that uses an x-ray machine to take clear, detailed pictures of your heart. For further information please visit [email or web address]. Please follow instruction sheet as given.    Follow-Up: Follow up with Dr. Dellia Cloud in 6 months.   Any Other Special Instructions Will Be Listed Below (If Applicable).     If you need a refill on your cardiac medications before your next appointment, please call your pharmacy. ZIO XT- Long Term Monitor Instructions   Your physician has requested you wear your ZIO patch monitor___7____days.   This is a single patch monitor.  [employer] supplies one patch monitor per enrollment.  Additional stickers are not available.   Please do not apply patch if you will be having a Nuclear Stress Test, Echocardiogram, Cardiac CT, MRI, or Chest Xray during the time frame you would be wearing the monitor. The patch cannot be worn during these tests.  You cannot remove and re-apply the ZIO XT patch monitor.   Your ZIO patch monitor will be sent USPS Priority mail from [facility] directly to your home address. The monitor may also be mailed to a PO BOX if home delivery is not available.   It may take 3-5 days to receive your monitor after you have been enrolled.   Once you have received you monitor, please review enclosed instructions.  Your monitor has already been registered assigning a specific monitor serial # to you.   Applying the monitor   Shave hair from upper left chest.   Hold abrader disc by orange tab.  Rub abrader in 40 strokes over left upper chest as indicated in your monitor instructions.   Clean area with 4 enclosed alcohol pads .  Use all pads to  assure are is cleaned thoroughly.  Let dry.   Apply patch as indicated in monitor instructions.  Patch will be place under collarbone on left side of chest with arrow pointing upward.   Rub patch adhesive wings for 2 minutes.Remove white label marked "1".  Remove white label marked "2".  Rub patch adhesive wings for 2 additional minutes.   While looking in a mirror, press and release button in center of patch.  A small green light will flash 3-4 times .  This will be your only indicator the monitor has been turned on.     Do not shower for the first 24 hours.  You may shower after the first 24 hours.   Press button if you feel a symptom. You will hear a small click.  Record Date, Time and Symptom in the Patient Log Book.   When you are ready to remove patch, follow instructions on last 2 pages of Patient Log Book.  Stick patch monitor onto last page of Patient Log Book.   Place Patient Log Book in [place] box.  Use locking tab on box and tape box closed securely.  The [employer] and [employer] has [employer] on it.  Please place in mailbox as soon as possible.  Your physician should have your test results approximately 7 days after the monitor has been mailed back to [facility].   Call [place] at [phone] if you have questions regarding your ZIO XT patch monitor.  Call them immediately  if you see an orange light blinking on your monitor.   If your monitor falls off in less than 4 days contact our Monitor department at [phone].  If your monitor becomes loose or falls off after 4 days call [employer] at [phone] for suggestions on securing your monitor.

## 2022-01-19 NOTE — Progress Notes (Signed)
Cardiology Office Note  Date: 01/19/2022   ID: Willie Capuchin., DOB September 26, 1950, MRN [id number]  PCP:  Celene Squibb, MD  Cardiologist:  Chalmers Guest, MD Electrophysiologist:  None   Reason for Office Visit: Palpitations evaluation at the request of Dr. Nevada Crane   History of Present Illness: Willie Albus. is a 72 y.o. male known to have HTN, DM 2, HLD was referred to cardiology clinic for evaluation of palpitations.  Patient reported having palpitations (feeling like his heart is skipping a beat) lasting for few seconds, occurs multiple times throughout the day, never resolves and was started x 6 months ago. He drinks 2 cups of coffee but has creamer occupying most of his cup. Does not take any energy drinks.  He is not sure about decrease in his stamina levels but reported having very mild decrease. Otherwise denied any angina, DOE, syncope, leg swelling. No prior history of MI/PCI/CABG.  Past Medical History:  Diagnosis Date   Diabetes mellitus type 2    GERD (gastroesophageal reflux disease)    History of COVID-19    jan/feb 2020   History of COVID-19 01/2020   mild symptoms all symptoms all symptoms resolved   History of gout    none in years   History of kidney stones    passed on won at least 15 yrs ago per pt on 03-15-2021   HTN (hypertension)    Hyperlipidemia    OsteoArthritis    Prostate cancer [facility])     Past Surgical History:  Procedure Laterality Date   COLONOSCOPY N/A 07/21/2015   Procedure: COLONOSCOPY;  Surgeon: Rogene Houston, MD;  [facility];  Service: Endoscopy;  Laterality: N/A;  730   CYSTOSCOPY N/A 03/17/2021   Procedure: CYSTOSCOPY;  Surgeon: Cleon Gustin, MD;  Location: [facility];  Service: Urology;  Laterality: N/A;   left shoudler surgery     in high school result of football accident   [place] N/A 03/17/2021   Procedure: RADIOACTIVE SEED IMPLANT/BRACHYTHERAPY IMPLANT;  Surgeon:  Cleon Gustin, MD;  Location: [facility];  Service: Urology;  Laterality: N/A;   right shoulder arthroplasty  1999   right shoulder exploration and implantation of antibiotic spacer 05-02-2010     TONSILLECTOMY  1963   and adenoids   vasectomy  1995    Current Outpatient Medications  Medication Sig Dispense Refill   allopurinol (ZYLOPRIM) 100 MG tablet Take 100 mg by mouth daily.     atenolol (TENORMIN) 50 MG tablet Take 50 mg by mouth daily.     glipiZIDE (GLUCOTROL XL) 10 MG 24 hr tablet Take 10 mg by mouth daily with breakfast.     lisinopril (PRINIVIL,ZESTRIL) 10 MG tablet Take 10 mg by mouth daily.     metFORMIN (GLUCOPHAGE-XR) 500 MG 24 hr tablet Take 1,000 mg by mouth 2 (two) times daily.     omeprazole (PRILOSEC) 20 MG capsule Take 20 mg by mouth at bedtime.     simvastatin (ZOCOR) 20 MG tablet Take 20 mg by mouth daily.     No current facility-administered medications for this visit.   Allergies:  Penicillins   Social History: The patient  reports that he quit smoking about 22 years ago. His smoking use included cigarettes. He has a 30.00 pack-year smoking history. He has never used smokeless tobacco. He reports that he does not drink alcohol and does not use drugs.   Family History: The  patient's family history is not on file.   ROS:  Please see the history of present illness. Otherwise, complete review of systems is positive for none.  All other systems are reviewed and negative.   Physical Exam: VS:  Ht '5\' 10"'$  (1.778 m)   Wt 200 lb (90.7 kg)   BMI 28.70 kg/m , BMI Body mass index is 28.7 kg/m.  Wt Readings from Last 3 Encounters:  01/19/22 200 lb (90.7 kg)  10/26/21 203 lb 8 oz (92.3 kg)  09/28/21 200 lb (90.7 kg)    General: Patient appears comfortable at rest. HEENT: Conjunctiva and lids normal, oropharynx clear with moist mucosa. Neck: Supple, no elevated JVP or carotid bruits, no thyromegaly. Lungs: Clear to auscultation, nonlabored  breathing at rest. Cardiac: Regular rate and rhythm, no S3 or significant systolic murmur, no pericardial rub. Abdomen: Soft, nontender, no hepatomegaly, bowel sounds present, no guarding or rebound. Extremities: No pitting edema, distal pulses 2+. Skin: Warm and dry. Musculoskeletal: No kyphosis. Neuropsychiatric: Alert and oriented x3, affect grossly appropriate.  ECG:  An ECG dated 01/19/2022 was personally reviewed today and demonstrated:  Normal sinus rhythm and frequent PVCs  Recent Labwork: 03/15/2021: ALT 15; AST 16; BUN 26; Creatinine, Ser 1.23; Hemoglobin 17.1; Platelets 210; Potassium 4.6; Sodium 141  No results found for: "CHOL", "TRIG", "HDL", "CHOLHDL", "VLDL", "LDLCALC", "LDLDIRECT"  Other Studies Reviewed Today:   Assessment and Plan: Patient is a 72 year old M known to have HTN, DM 2, HLD was referred to cardiology clinic for evaluation of palpitations.  # Palpitations likely secondary to frequent PVCs -Obtain 2-week event monitor to quantify the burden of PVCs. He is already on atenolol 50 mg once daily for many many years and would continue it while wearing the heart monitor. If he has significant PVC burden on event monitor, he will benefit from switching his atenolol to metoprolol and obtain 2D echocardiogram.  # Screening of CAD -Obtain CT calcium scoring of coronaries  # HTN, controlled -Continue atenolol 50 mg once daily -Continue lisinopril 10 mg once daily -Management of HTN per PCP -Instructed patient to check blood pressures in a.m. and p.m.  # HLD -Continue simvastatin 20 mg nightly.  No myalgias. -Management of HLD per PCP  I have spent a total of 45 minutes with patient reviewing chart, EKGs, labs and examining patient as well as establishing an assessment and plan that was discussed with the patient.  > 50% of time was spent in direct patient care.  '    Medication Adjustments/Labs and Tests Ordered: Current medicines are reviewed at length with  the patient today.  Concerns regarding medicines are outlined above.   Tests Ordered: Orders Placed This Encounter  Procedures   EKG 12-Lead    Medication Changes: No orders of the defined types were placed in this encounter.   Disposition:  Follow up  6 months  Signed, Meris Reede Fidel Levy, MD, 01/19/2022 8:05 AM    [place] [facility] [address], [place], [place] [address]

## 2022-01-27 ENCOUNTER — Ambulatory Visit: Payer: Medicare Other | Admitting: Urology

## 2022-01-27 ENCOUNTER — Encounter: Payer: Self-pay | Admitting: Urology

## 2022-01-27 VITALS — BP 131/76 | HR 94

## 2022-01-27 DIAGNOSIS — N401 Enlarged prostate with lower urinary tract symptoms: Secondary | ICD-10-CM

## 2022-01-27 DIAGNOSIS — N138 Other obstructive and reflux uropathy: Secondary | ICD-10-CM

## 2022-01-27 DIAGNOSIS — C61 Malignant neoplasm of prostate: Secondary | ICD-10-CM

## 2022-01-27 DIAGNOSIS — R339 Retention of urine, unspecified: Secondary | ICD-10-CM | POA: Diagnosis not present

## 2022-01-27 NOTE — Progress Notes (Unsigned)
01/27/2022 12:59 PM   Willie Oneal. [birth year] [id number]  Referring provider: Celene Squibb, MD [address],  [place] [address]  Followup Prostate cancer and BPH   HPI: Willie Oneal is a 72yo here for followup for prostate cancer and BPH. He had brachytherapy in 03/2021.  IPSS 5 QOL 3 off of rapaflo '8mg'$ . He has urinary urgency which is improving. No dysuria or hematuria. No recent PSA.  Last PSA 09/2021 was 0.4.   PMH: Past Medical History:  Diagnosis Date   Diabetes mellitus type 2    GERD (gastroesophageal reflux disease)    History of COVID-19    jan/feb 2020   History of COVID-19 01/2020   mild symptoms all symptoms all symptoms resolved   History of gout    none in years   History of kidney stones    passed on won at least 15 yrs ago per pt on 03-15-2021   HTN (hypertension)    Hyperlipidemia    OsteoArthritis    Prostate cancer [facility])     Surgical History: Past Surgical History:  Procedure Laterality Date   COLONOSCOPY N/A 07/21/2015   Procedure: COLONOSCOPY;  Surgeon: Rogene Houston, MD;  [facility];  Service: Endoscopy;  Laterality: N/A;  730   CYSTOSCOPY N/A 03/17/2021   Procedure: CYSTOSCOPY;  Surgeon: Cleon Gustin, MD;  Location[facility];  Service: Urology;  Laterality: N/A;   left shoudler surgery     in high school result of football accident   [place] N/A 03/17/2021   Procedure: RADIOACTIVE SEED IMPLANT/BRACHYTHERAPY IMPLANT;  Surgeon: Cleon Gustin, MD;  Location: [facility];  Service: Urology;  Laterality: N/A;   right shoulder arthroplasty  1999   right shoulder exploration and implantation of antibiotic spacer 05-02-2010     TONSILLECTOMY  1963   and adenoids   vasectomy  1995    Home Medications:  Allergies as of 01/27/2022       Reactions   Penicillins    Unspecified reaction as child        Medication List        Accurate as of January 27, 2022  12:59 PM. If you have any questions, ask your nurse or doctor.          allopurinol 100 MG tablet Commonly known as: ZYLOPRIM Take 100 mg by mouth daily.   atenolol 50 MG tablet Commonly known as: TENORMIN Take 50 mg by mouth daily.   glipiZIDE 10 MG 24 hr tablet Commonly known as: GLUCOTROL XL Take 10 mg by mouth daily with breakfast.   lisinopril 10 MG tablet Commonly known as: ZESTRIL Take 10 mg by mouth daily.   metFORMIN 500 MG 24 hr tablet Commonly known as: GLUCOPHAGE-XR Take 1,000 mg by mouth 2 (two) times daily.   omeprazole 20 MG capsule Commonly known as: PRILOSEC Take 20 mg by mouth at bedtime.   simvastatin 20 MG tablet Commonly known as: ZOCOR Take 20 mg by mouth daily.        Allergies:  Allergies  Allergen Reactions   Penicillins     Unspecified reaction as child    Family History: No family history on file.  Social History:  reports that he quit smoking about 22 years ago. His smoking use included cigarettes. He has a 30.00 pack-year smoking history. He has never used smokeless tobacco. He reports that he does not drink alcohol and does not  use drugs.  ROS: All other review of systems were reviewed and are negative except what is noted above in HPI  Physical Exam: BP 131/76   Pulse 94   Constitutional:  Alert and oriented, No acute distress. HEENT: [place] AT, moist mucus membranes.  Trachea midline, no masses. Cardiovascular: No clubbing, cyanosis, or edema. Respiratory: Normal respiratory effort, no increased work of breathing. GI: Abdomen is soft, nontender, nondistended, no abdominal masses GU: No CVA tenderness.  Lymph: No cervical or inguinal lymphadenopathy. Skin: No rashes, bruises or suspicious lesions. Neurologic: Grossly intact, no focal deficits, moving all 4 extremities. Psychiatric: Normal mood and affect.  Laboratory Data: Lab Results  Component Value Date   WBC 7.8 03/15/2021   HGB 17.1 (H) 03/15/2021   HCT 50.5  03/15/2021   MCV 86.6 03/15/2021   PLT 210 03/15/2021    Lab Results  Component Value Date   CREATININE 1.23 03/15/2021    No results found for: "PSA"  No results found for: "TESTOSTERONE"  No results found for: "HGBA1C"  Urinalysis    Component Value Date/Time   COLORURINE YELLOW 03/26/2021 1350   APPEARANCEUR Clear 09/28/2021 1331   LABSPEC 1.025 03/26/2021 1350   PHURINE 5.0 03/26/2021 1350   GLUCOSEU 2+ (A) 09/28/2021 1331   HGBUR MODERATE (A) 03/26/2021 1350   BILIRUBINUR Negative 09/28/2021 1331   KETONESUR NEGATIVE 03/26/2021 1350   PROTEINUR 3+ (A) 09/28/2021 1331   PROTEINUR 30 (A) 03/26/2021 1350   UROBILINOGEN 0.2 05/02/2011 0634   NITRITE Negative 09/28/2021 1331   NITRITE NEGATIVE 03/26/2021 1350   LEUKOCYTESUR Negative 09/28/2021 1331   LEUKOCYTESUR NEGATIVE 03/26/2021 1350    Lab Results  Component Value Date   LABMICR See below: 09/28/2021   WBCUA 0-5 09/28/2021   LABEPIT 0-10 09/28/2021   MUCUS Present (A) 09/28/2021   BACTERIA None seen 09/28/2021    Pertinent Imaging:  No results found for this or any previous visit.  No results found for this or any previous visit.  No results found for this or any previous visit.  No results found for this or any previous visit.  No results found for this or any previous visit.  No valid procedures specified. No results found for this or any previous visit.  No results found for this or any previous visit.   Assessment & Plan:    1. Prostate cancer ([place]); T1cNxMx; Gleason grade group 1 & 2; favorable intermediate risk; s/p LDR brachytherapy 03/17/21 -PSA today, if stable followup in 3 months with PSA - Urinalysis, Routine w reflex microscopic  2. Benign prostatic hyperplasia with urinary obstruction -patient defers therapy at this time  3. Urinary retention -resolved   No follow-ups on file.  [name], MD  [facility] [place]

## 2022-01-27 NOTE — Patient Instructions (Signed)

## 2022-01-28 LAB — PSA: Prostate Specific Ag, Serum: 0.4 ng/mL (ref 0.0–4.0)

## 2022-01-31 DIAGNOSIS — R002 Palpitations: Secondary | ICD-10-CM | POA: Diagnosis not present

## 2022-02-07 ENCOUNTER — Other Ambulatory Visit (HOSPITAL_COMMUNITY): Payer: Medicare Other

## 2022-02-28 ENCOUNTER — Telehealth: Payer: Self-pay

## 2022-02-28 DIAGNOSIS — I493 Ventricular premature depolarization: Secondary | ICD-10-CM

## 2022-02-28 MED ORDER — METOPROLOL TARTRATE 50 MG PO TABS: 50.0000 mg | ORAL_TABLET | Freq: Two times a day (BID) | ORAL | 3 refills | Status: DC

## 2022-02-28 NOTE — Telephone Encounter (Signed)
-----   Message from Chalmers Guest, MD sent at 02/27/2022  4:35 PM EST ----- PVC burden is 22.2%. Switch atenolol to metoprolol tartrate 50 mg twice daily. Obtain 2D echocardiogram.

## 2022-02-28 NOTE — Telephone Encounter (Signed)
Patient notified and verbalized understanding. Patient had no questions or concerns at this time. PCP copied.  

## 2022-03-01 ENCOUNTER — Ambulatory Visit: Payer: 59 | Admitting: Internal Medicine

## 2022-03-01 ENCOUNTER — Encounter: Payer: Self-pay | Admitting: Internal Medicine

## 2022-04-05 DIAGNOSIS — M109 Gout, unspecified: Secondary | ICD-10-CM | POA: Diagnosis not present

## 2022-04-05 DIAGNOSIS — E1169 Type 2 diabetes mellitus with other specified complication: Secondary | ICD-10-CM | POA: Diagnosis not present

## 2022-04-05 DIAGNOSIS — E782 Mixed hyperlipidemia: Secondary | ICD-10-CM | POA: Diagnosis not present

## 2022-04-12 DIAGNOSIS — R945 Abnormal results of liver function studies: Secondary | ICD-10-CM | POA: Diagnosis not present

## 2022-04-12 DIAGNOSIS — R197 Diarrhea, unspecified: Secondary | ICD-10-CM | POA: Diagnosis not present

## 2022-04-12 DIAGNOSIS — R339 Retention of urine, unspecified: Secondary | ICD-10-CM | POA: Diagnosis not present

## 2022-04-12 DIAGNOSIS — E782 Mixed hyperlipidemia: Secondary | ICD-10-CM | POA: Diagnosis not present

## 2022-04-12 DIAGNOSIS — M109 Gout, unspecified: Secondary | ICD-10-CM | POA: Diagnosis not present

## 2022-04-12 DIAGNOSIS — R809 Proteinuria, unspecified: Secondary | ICD-10-CM | POA: Diagnosis not present

## 2022-04-12 DIAGNOSIS — I10 Essential (primary) hypertension: Secondary | ICD-10-CM | POA: Diagnosis not present

## 2022-04-12 DIAGNOSIS — K219 Gastro-esophageal reflux disease without esophagitis: Secondary | ICD-10-CM | POA: Diagnosis not present

## 2022-04-12 DIAGNOSIS — E1169 Type 2 diabetes mellitus with other specified complication: Secondary | ICD-10-CM | POA: Diagnosis not present

## 2022-04-12 DIAGNOSIS — R002 Palpitations: Secondary | ICD-10-CM | POA: Diagnosis not present

## 2022-04-13 ENCOUNTER — Ambulatory Visit (HOSPITAL_COMMUNITY)
Admission: RE | Admit: 2022-04-13 | Discharge: 2022-04-13 | Disposition: A | Discharge status: Home / Self Care | Payer: Medicare Other | Source: Ambulatory Visit | Attending: Internal Medicine | Admitting: Internal Medicine

## 2022-04-13 DIAGNOSIS — I493 Ventricular premature depolarization: Secondary | ICD-10-CM

## 2022-04-13 DIAGNOSIS — E785 Hyperlipidemia, unspecified: Secondary | ICD-10-CM | POA: Insufficient documentation

## 2022-04-13 DIAGNOSIS — E119 Type 2 diabetes mellitus without complications: Secondary | ICD-10-CM | POA: Diagnosis not present

## 2022-04-13 DIAGNOSIS — I10 Essential (primary) hypertension: Secondary | ICD-10-CM | POA: Diagnosis not present

## 2022-04-13 LAB — ECHOCARDIOGRAM COMPLETE
AR max vel: 1.95 cm2
AV Area VTI: 2.17 cm2
AV Area mean vel: 2.09 cm2
AV Mean grad: 5.5 mmHg
AV Peak grad: 11.4 mmHg
Ao pk vel: 1.69 m/s
Area-P 1/2: 4.24 cm2
MV VTI: 2.47 cm2
S' Lateral: 3.3 cm

## 2022-04-13 NOTE — Progress Notes (Signed)
*  PRELIMINARY RESULTS* Echocardiogram 2D Echocardiogram has been performed.  Carolyne Fiscal 04/13/2022, 1:47 PM

## 2022-04-17 ENCOUNTER — Ambulatory Visit (HOSPITAL_COMMUNITY)
Admission: RE | Admit: 2022-04-17 | Discharge: 2022-04-17 | Disposition: A | Discharge status: Home / Self Care | Payer: Medicare Other | Source: Ambulatory Visit | Attending: Internal Medicine | Admitting: Internal Medicine

## 2022-04-17 DIAGNOSIS — Z136 Encounter for screening for cardiovascular disorders: Secondary | ICD-10-CM | POA: Insufficient documentation

## 2022-04-28 ENCOUNTER — Other Ambulatory Visit: Payer: Medicare Other

## 2022-04-28 DIAGNOSIS — N401 Enlarged prostate with lower urinary tract symptoms: Secondary | ICD-10-CM

## 2022-04-28 DIAGNOSIS — C61 Malignant neoplasm of prostate: Secondary | ICD-10-CM

## 2022-04-29 LAB — PSA: Prostate Specific Ag, Serum: 0.3 ng/mL (ref 0.0–4.0)

## 2022-05-05 ENCOUNTER — Ambulatory Visit: Payer: Medicare Other | Admitting: Urology

## 2022-05-05 VITALS — BP 117/62 | HR 81

## 2022-05-05 DIAGNOSIS — R351 Nocturia: Secondary | ICD-10-CM | POA: Diagnosis not present

## 2022-05-05 DIAGNOSIS — N401 Enlarged prostate with lower urinary tract symptoms: Secondary | ICD-10-CM

## 2022-05-05 DIAGNOSIS — C61 Malignant neoplasm of prostate: Secondary | ICD-10-CM | POA: Diagnosis not present

## 2022-05-05 DIAGNOSIS — N138 Other obstructive and reflux uropathy: Secondary | ICD-10-CM | POA: Diagnosis not present

## 2022-05-05 LAB — URINALYSIS, ROUTINE W REFLEX MICROSCOPIC
Bilirubin, UA: NEGATIVE
Leukocytes,UA: NEGATIVE
Nitrite, UA: NEGATIVE
RBC, UA: NEGATIVE
Specific Gravity, UA: 1.015 (ref 1.005–1.030)
Urobilinogen, Ur: 0.2 mg/dL (ref 0.2–1.0)
pH, UA: 5 (ref 5.0–7.5)

## 2022-05-05 LAB — MICROSCOPIC EXAMINATION: Bacteria, UA: NONE SEEN

## 2022-05-05 NOTE — Patient Instructions (Signed)

## 2022-05-05 NOTE — Progress Notes (Unsigned)
05/05/2022 9:37 AM   Willie Oneal. 1950/06/18 [id number]  Referring provider: Benita Stabile, MD [address] [place],  [name] [address]  Followup prostate cancer and BPH   HPI: PSA decreased to 0.3. IPSS 7 QOl 2 off of rapaflo. He has urinary urgency. Nocturia 2x.    PMH: Past Medical History:  Diagnosis Date   Diabetes mellitus type 2    GERD (gastroesophageal reflux disease)    History of COVID-19    jan/feb 2020   History of COVID-19 01/2020   mild symptoms all symptoms all symptoms resolved   History of gout    none in years   History of kidney stones    passed on won at least 15 yrs ago per pt on 03-15-2021   HTN (hypertension)    Hyperlipidemia    OsteoArthritis    Prostate cancer [facility])     Surgical History: Past Surgical History:  Procedure Laterality Date   COLONOSCOPY N/A 07/21/2015   Procedure: COLONOSCOPY;  Surgeon: Malissa Hippo, MD;  [facility];  Service: Endoscopy;  Laterality: N/A;  730   CYSTOSCOPY N/A 03/17/2021   Procedure: CYSTOSCOPY;  Surgeon: Malen Gauze, MD;  Location[facility];  Service: Urology;  Laterality: N/A;   left shoudler surgery     in high school result of football accident   RADIOACTIVE SEED IMPLANT N/A 03/17/2021   Procedure: RADIOACTIVE SEED IMPLANT/BRACHYTHERAPY IMPLANT;  Surgeon: Malen Gauze, MD;  Location: [facility];  Service: Urology;  Laterality: N/A;   right shoulder arthroplasty  1999   right shoulder exploration and implantation of antibiotic spacer 05-02-2010     TONSILLECTOMY  1963   and adenoids   vasectomy  1995    Home Medications:  Allergies as of 05/05/2022       Reactions   Penicillins    Unspecified reaction as child        Medication List        Accurate as of May 05, 2022  9:37 AM. If you have any questions, ask your nurse or doctor.          allopurinol 100 MG tablet Commonly known as: ZYLOPRIM Take 100 mg by mouth  daily.   glipiZIDE 10 MG 24 hr tablet Commonly known as: GLUCOTROL XL Take 10 mg by mouth daily with breakfast.   lisinopril 10 MG tablet Commonly known as: ZESTRIL Take 10 mg by mouth daily.   metFORMIN 500 MG 24 hr tablet Commonly known as: GLUCOPHAGE-XR Take 1,000 mg by mouth 2 (two) times daily.   metoprolol tartrate 50 MG tablet Commonly known as: LOPRESSOR Take 1 tablet (50 mg total) by mouth 2 (two) times daily.   omeprazole 20 MG capsule Commonly known as: PRILOSEC Take 20 mg by mouth at bedtime.   simvastatin 20 MG tablet Commonly known as: ZOCOR Take 20 mg by mouth daily.        Allergies:  Allergies  Allergen Reactions   Penicillins     Unspecified reaction as child    Family History: No family history on file.  Social History:  reports that he quit smoking about 22 years ago. His smoking use included cigarettes. He has a 30.00 pack-year smoking history. He has never used smokeless tobacco. He reports that he does not drink alcohol and does not use drugs.  ROS: All other review of systems were reviewed and are negative except what is noted above in HPI  Physical Exam: BP 117/62   Pulse 81   Constitutional:  Alert and oriented, No acute distress. HEENT: [place] AT, moist mucus membranes.  Trachea midline, no masses. Cardiovascular: No clubbing, cyanosis, or edema. Respiratory: Normal respiratory effort, no increased work of breathing. GI: Abdomen is soft, nontender, nondistended, no abdominal masses GU: No CVA tenderness.  Lymph: No cervical or inguinal lymphadenopathy. Skin: No rashes, bruises or suspicious lesions. Neurologic: Grossly intact, no focal deficits, moving all 4 extremities. Psychiatric: Normal mood and affect.  Laboratory Data: Lab Results  Component Value Date   WBC 7.8 03/15/2021   HGB 17.1 (H) 03/15/2021   HCT 50.5 03/15/2021   MCV 86.6 03/15/2021   PLT 210 03/15/2021    Lab Results  Component Value Date   CREATININE 1.23  03/15/2021    No results found for: "PSA"  No results found for: "TESTOSTERONE"  No results found for: "HGBA1C"  Urinalysis    Component Value Date/Time   COLORURINE YELLOW 03/26/2021 1350   APPEARANCEUR Clear 09/28/2021 1331   LABSPEC 1.025 03/26/2021 1350   PHURINE 5.0 03/26/2021 1350   GLUCOSEU 2+ (A) 09/28/2021 1331   HGBUR MODERATE (A) 03/26/2021 1350   BILIRUBINUR Negative 09/28/2021 1331   KETONESUR NEGATIVE 03/26/2021 1350   PROTEINUR 3+ (A) 09/28/2021 1331   PROTEINUR 30 (A) 03/26/2021 1350   UROBILINOGEN 0.2 05/02/2011 0634   NITRITE Negative 09/28/2021 1331   NITRITE NEGATIVE 03/26/2021 1350   LEUKOCYTESUR Negative 09/28/2021 1331   LEUKOCYTESUR NEGATIVE 03/26/2021 1350    Lab Results  Component Value Date   LABMICR See below: 09/28/2021   WBCUA 0-5 09/28/2021   LABEPIT 0-10 09/28/2021   MUCUS Present (A) 09/28/2021   BACTERIA None seen 09/28/2021    Pertinent Imaging: *** No results found for this or any previous visit.  No results found for this or any previous visit.  No results found for this or any previous visit.  No results found for this or any previous visit.  No results found for this or any previous visit.  No valid procedures specified. No results found for this or any previous visit.  No results found for this or any previous visit.   Assessment & Plan:    1. Prostate cancer ([facility]); T1cNxMx; Gleason grade group 1 & 2; favorable intermediate risk; s/p LDR brachytherapy 03/17/21 *** - Urinalysis, Routine w reflex microscopic  2. Benign prostatic hyperplasia with urinary obstruction ***  3. Nocturia ***   No follow-ups on file.  Wilkie Aye, MD  [facility] [place]

## 2022-05-09 ENCOUNTER — Encounter: Payer: Self-pay | Admitting: Urology

## 2022-07-19 DIAGNOSIS — E782 Mixed hyperlipidemia: Secondary | ICD-10-CM | POA: Diagnosis not present

## 2022-07-19 DIAGNOSIS — M109 Gout, unspecified: Secondary | ICD-10-CM | POA: Diagnosis not present

## 2022-07-19 DIAGNOSIS — E1169 Type 2 diabetes mellitus with other specified complication: Secondary | ICD-10-CM | POA: Diagnosis not present

## 2022-07-20 ENCOUNTER — Ambulatory Visit: Payer: Medicare Other | Attending: Internal Medicine | Admitting: Internal Medicine

## 2022-07-20 ENCOUNTER — Encounter: Payer: Self-pay | Admitting: Internal Medicine

## 2022-07-20 ENCOUNTER — Ambulatory Visit: Payer: Medicare Other | Admitting: Internal Medicine

## 2022-07-20 VITALS — BP 140/60 | HR 51 | Ht 70.5 in | Wt 203.0 lb

## 2022-07-20 DIAGNOSIS — I493 Ventricular premature depolarization: Secondary | ICD-10-CM | POA: Diagnosis not present

## 2022-07-20 NOTE — Patient Instructions (Signed)
Medication Instructions:  Your physician recommends that you continue on your current medications as directed. Please refer to the Current Medication list given to you today.  *If you need a refill on your cardiac medications before your next appointment, please call your pharmacy*   Lab Work: none If you have labs (blood work) drawn today and your tests are completely normal, you will receive your results only by: MyChart Message (if you have MyChart) OR A paper copy in the mail If you have any lab test that is abnormal or we need to change your treatment, we will call you to review the results.   Testing/Procedures: In 6 months- Zio Monitor- Please call our office to register monitor   Follow-Up: At [facility], you and your health needs are our priority.  As part of our continuing mission to provide you with exceptional heart care, we have created designated Provider Care Teams.  These Care Teams include your primary Cardiologist (physician) and Advanced Practice Providers (APPs -  Physician Assistants and Nurse Practitioners) who all work together to provide you with the care you need, when you need it.  We recommend signing up for the patient portal called "MyChart".  Sign up information is provided on this After Visit Summary.  MyChart is used to connect with patients for Virtual Visits (Telemedicine).  Patients are able to view lab/test results, encounter notes, upcoming appointments, etc.  Non-urgent messages can be sent to your provider as well.   To learn more about what you can do with MyChart, go to [email or web address].    Your next appointment:   1 year(s)  Provider:   You may see Vishnu P Mallipeddi, MD or one of the following Advanced Practice Providers on your designated Care Team:   [place], PA-C  Jacolyn Reedy, [place]     Other Instructions

## 2022-07-20 NOTE — Progress Notes (Signed)
Cardiology Office Note  Date: 07/20/2022   ID: Durward Fortes., DOB [birth year], MRN [id number]  PCP:  Benita Stabile, MD  Cardiologist:  Marjo Bicker, MD Electrophysiologist:  None    History of Present Illness: Willie Oneal. is a 72 y.o. male known to have HTN, DM 2, HLD, 22.2% PVC burden is here for f/u visit.  Patient was referred to cardiology clinic for evaluation of palpitations, event monitor from 1/24 showed 22.2% PVC burden.  Atenolol was switched to metoprolol tartrate 50 mg twice daily.  He is here for follow-up visit.  Denies any palpitations.  Compliant with medications.  No dizziness, presyncope or syncope.  No chest pain or SOB.  No orthopnea, PND or leg swelling.  Past Medical History:  Diagnosis Date   Diabetes mellitus type 2    GERD (gastroesophageal reflux disease)    History of COVID-19    jan/feb 2020   History of COVID-19 01/2020   mild symptoms all symptoms all symptoms resolved   History of gout    none in years   History of kidney stones    passed on won at least 15 yrs ago per pt on 03-15-2021   HTN (hypertension)    Hyperlipidemia    OsteoArthritis    Prostate cancer [facility]     Past Surgical History:  Procedure Laterality Date   COLONOSCOPY N/A 07/21/2015   Procedure: COLONOSCOPY;  Surgeon: Malissa Hippo, MD;  [facility];  Service: Endoscopy;  Laterality: N/A;  730   CYSTOSCOPY N/A 03/17/2021   Procedure: CYSTOSCOPY;  Surgeon: Malen Gauze, MD;  Location: [facility];  Service: Urology;  Laterality: N/A;   left shoudler surgery     in high school result of football accident   RADIOACTIVE SEED IMPLANT N/A 03/17/2021   Procedure: RADIOACTIVE SEED IMPLANT/BRACHYTHERAPY IMPLANT;  Surgeon: Malen Gauze, MD;  Location: [facility];  Service: Urology;  Laterality: N/A;   right shoulder arthroplasty  1999   right shoulder exploration and implantation of antibiotic spacer 05-02-2010      TONSILLECTOMY  1963   and adenoids   vasectomy  1995    Current Outpatient Medications  Medication Sig Dispense Refill   metoprolol tartrate (LOPRESSOR) 50 MG tablet Take 1 tablet (50 mg total) by mouth 2 (two) times daily. 180 tablet 3   allopurinol (ZYLOPRIM) 100 MG tablet Take 100 mg by mouth daily.     glipiZIDE (GLUCOTROL XL) 10 MG 24 hr tablet Take 10 mg by mouth daily with breakfast.     lisinopril (PRINIVIL,ZESTRIL) 10 MG tablet Take 10 mg by mouth daily.     metFORMIN (GLUCOPHAGE-XR) 500 MG 24 hr tablet Take 1,000 mg by mouth 2 (two) times daily.     omeprazole (PRILOSEC) 20 MG capsule Take 20 mg by mouth at bedtime.     simvastatin (ZOCOR) 20 MG tablet Take 20 mg by mouth daily.     No current facility-administered medications for this visit.   Allergies:  Penicillins   Social History: The patient  reports that he quit smoking about 22 years ago. His smoking use included cigarettes. He started smoking about 52 years ago. He has a 30 pack-year smoking history. He has never used smokeless tobacco. He reports that he does not drink alcohol and does not use drugs.   Family History: The patient's family history is not on file.   ROS:  Please see the history of present  illness. Otherwise, complete review of systems is positive for none.  All other systems are reviewed and negative.   Physical Exam: VS:  There were no vitals taken for this visit., BMI There is no height or weight on file to calculate BMI.  Wt Readings from Last 3 Encounters:  01/19/22 200 lb (90.7 kg)  10/26/21 203 lb 8 oz (92.3 kg)  09/28/21 200 lb (90.7 kg)    General: Patient appears comfortable at rest. HEENT: Conjunctiva and lids normal, oropharynx clear with moist mucosa. Neck: Supple, no elevated JVP or carotid bruits, no thyromegaly. Lungs: Clear to auscultation, nonlabored breathing at rest. Cardiac: Regular rate and rhythm, no S3 or significant systolic murmur, no pericardial rub. Abdomen: Soft,  nontender, no hepatomegaly, bowel sounds present, no guarding or rebound. Extremities: No pitting edema, distal pulses 2+. Skin: Warm and dry. Musculoskeletal: No kyphosis. Neuropsychiatric: Alert and oriented x3, affect grossly appropriate.  ECG:  An ECG dated 01/19/2022 was personally reviewed today and demonstrated:  Normal sinus rhythm and frequent PVCs  Recent Labwork: No results found for requested labs within last 365 days.  No results found for: "CHOL", "TRIG", "HDL", "CHOLHDL", "VLDL", "LDLCALC", "LDLDIRECT"  Other Studies Reviewed Today:   Assessment and Plan: Patient is a 71 year old M known to have HTN, DM 2, HLD was referred to cardiology clinic for evaluation of palpitations.  # PVC burden 22.2%, symptomatic -Event monitor from 1/24 showed 22.2% PVC burden. Atenolol was switched to metoprolol tartrate 50 mg twice daily after which his symptoms of palpitations completely resolved. EKG today showed ventricular bigeminy. No symptoms, will monitor.  Echocardiogram from 1/24 showed normal LVEF and no valvular heart disease. Coronary calcium score is 0. -Obtain event monitor in 6 months to quantify PVC burden.  # HTN, controlled -Continue lisinopril 10 mg once daily, metoprolol titrate 50 mg twice daily.  # HLD -Continue simvastatin 20 mg at bedtime, goal LDL less than 100.  I have spent a total of 30 minutes with patient reviewing chart, EKGs, labs and examining patient as well as establishing an assessment and plan that was discussed with the patient.  > 50% of time was spent in direct patient care.  '    Medication Adjustments/Labs and Tests Ordered: Current medicines are reviewed at length with the patient today.  Concerns regarding medicines are outlined above.   Tests Ordered: No orders of the defined types were placed in this encounter.   Medication Changes: No orders of the defined types were placed in this encounter.   Disposition:  Follow up  1  year  Signed, Jaaliyah Lucatero Verne Spurr, MD, 07/20/2022 11:26 AM    [facility] [address], [place], [name] [address]

## 2022-07-26 DIAGNOSIS — I10 Essential (primary) hypertension: Secondary | ICD-10-CM | POA: Diagnosis not present

## 2022-07-26 DIAGNOSIS — E1169 Type 2 diabetes mellitus with other specified complication: Secondary | ICD-10-CM | POA: Diagnosis not present

## 2022-07-26 DIAGNOSIS — K219 Gastro-esophageal reflux disease without esophagitis: Secondary | ICD-10-CM | POA: Diagnosis not present

## 2022-07-26 DIAGNOSIS — R339 Retention of urine, unspecified: Secondary | ICD-10-CM | POA: Diagnosis not present

## 2022-07-26 DIAGNOSIS — E782 Mixed hyperlipidemia: Secondary | ICD-10-CM | POA: Diagnosis not present

## 2022-07-26 DIAGNOSIS — R945 Abnormal results of liver function studies: Secondary | ICD-10-CM | POA: Diagnosis not present

## 2022-07-26 DIAGNOSIS — J302 Other seasonal allergic rhinitis: Secondary | ICD-10-CM | POA: Diagnosis not present

## 2022-07-26 DIAGNOSIS — R002 Palpitations: Secondary | ICD-10-CM | POA: Diagnosis not present

## 2022-07-26 DIAGNOSIS — R809 Proteinuria, unspecified: Secondary | ICD-10-CM | POA: Diagnosis not present

## 2022-07-26 DIAGNOSIS — M109 Gout, unspecified: Secondary | ICD-10-CM | POA: Diagnosis not present

## 2022-08-22 DIAGNOSIS — R059 Cough, unspecified: Secondary | ICD-10-CM | POA: Diagnosis not present

## 2022-08-22 DIAGNOSIS — U071 COVID-19: Secondary | ICD-10-CM | POA: Diagnosis not present

## 2022-09-06 ENCOUNTER — Encounter: Payer: Self-pay | Admitting: Gastroenterology

## 2022-09-06 ENCOUNTER — Ambulatory Visit: Payer: Medicare Other | Admitting: Gastroenterology

## 2022-09-06 VITALS — BP 131/83 | HR 78 | Temp 96.8°F | Ht 70.0 in | Wt 196.8 lb

## 2022-09-06 DIAGNOSIS — R197 Diarrhea, unspecified: Secondary | ICD-10-CM

## 2022-09-06 NOTE — Progress Notes (Signed)
Referring Provider: Benita Stabile, MD Primary Care Physician:  Benita Stabile, MD Primary GI Physician: Dr. Marletta Lor  Chief Complaint  Patient presents with   Diarrhea    Diarrhea for a couple of days now. Goes about 4 or 5 times a day.     HPI:   Willie Oneal. is a 72 y.o. male with history of chronic diarrhea for the last 2-3+ years per prior notes, presenting today with chief complaint of diarrhea.   Last seen in our office in October 2023 for diarrhea reporting diarrhea 77 presented the time with no associated abdominal pain.  He was on metformin 1000 mg twice daily.  Had recently decrease this to once a day with mild improvement in diarrhea.  GERD fairly well-controlled on omeprazole 20 mg daily.  He did admit to eating a lot of spicy food and eating late at night on occasion.  No alarm symptoms.  Patient was advised to take 1 Imodium 20 to 30 minutes before breakfast daily, daily Benefiber, continue omeprazole daily and to take second dose if needed.  Recommended 3 to 90-month follow-up.  Today:  Last night, he developed significant diarrhea. Had been doing well prior to this with 2 bowel movements per day. Stools are watery, was up all night with diarrhea.  Stated he probably had 2 dozen stools.  He is already had 3 bowel movements this morning.  Has not taken anything for this.  No abdominal pain, BRBPR, melena.  Eats a lot of hot peppers. No abx, sick contacts.  Drinks well water.   No new medications.  Did not eat anything out of the ordinary recently.  Had COVID a couple weeks ago.    Last colonoscopy 2017 showed a single non- bleeding colonic angiodysplastic lesion. few small benign polyps, not adenomatous on pathology, 10-year recall. Denies any family history of colon cancer.   Past Medical History:  Diagnosis Date   Diabetes mellitus type 2    GERD (gastroesophageal reflux disease)    History of COVID-19    jan/feb 2020   History of COVID-19 01/2020   mild  symptoms all symptoms all symptoms resolved   History of gout    none in years   History of kidney stones    passed on won at least 15 yrs ago per pt on 03-15-2021   HTN (hypertension)    Hyperlipidemia    OsteoArthritis    Prostate cancer [facility])     Past Surgical History:  Procedure Laterality Date   COLONOSCOPY N/A 07/21/2015   Procedure: COLONOSCOPY;  Surgeon: Malissa Hippo, MD;  [facility];  Service: Endoscopy;  Laterality: N/A;  730   CYSTOSCOPY N/A 03/17/2021   Procedure: CYSTOSCOPY;  Surgeon: Malen Gauze, MD;  Location: [facility];  Service: Urology;  Laterality: N/A;   left shoudler surgery     in high school result of football accident   RADIOACTIVE SEED IMPLANT N/A 03/17/2021   Procedure: RADIOACTIVE SEED IMPLANT/BRACHYTHERAPY IMPLANT;  Surgeon: Malen Gauze, MD;  Location: [facility];  Service: Urology;  Laterality: N/A;   right shoulder arthroplasty  1999   right shoulder exploration and implantation of antibiotic spacer 05-02-2010     TONSILLECTOMY  1963   and adenoids   vasectomy  1995    Current Outpatient Medications  Medication Sig Dispense Refill   allopurinol (ZYLOPRIM) 100 MG tablet Take 100 mg by mouth daily.     dapagliflozin  propanediol (FARXIGA) 10 MG TABS tablet Take by mouth daily.     glipiZIDE (GLUCOTROL XL) 10 MG 24 hr tablet Take 10 mg by mouth daily with breakfast.     Lancets (ONETOUCH DELICA PLUS LANCET30G) MISC      lisinopril (PRINIVIL,ZESTRIL) 10 MG tablet Take 10 mg by mouth daily.     metFORMIN (GLUCOPHAGE-XR) 500 MG 24 hr tablet Take 1,000 mg by mouth 2 (two) times daily.     metoprolol tartrate (LOPRESSOR) 50 MG tablet Take 1 tablet (50 mg total) by mouth 2 (two) times daily. 180 tablet 3   ONETOUCH VERIO test strip 1 each 2 (two) times daily.     pantoprazole (PROTONIX) 20 MG tablet Take 20 mg by mouth 2 (two) times daily.     simvastatin (ZOCOR) 20 MG tablet Take 20 mg by mouth  daily.     No current facility-administered medications for this visit.    Allergies as of 09/06/2022 - Review Complete 09/06/2022  Allergen Reaction Noted   Penicillins  03/14/2021    History reviewed. No pertinent family history.  Social History   Socioeconomic History   Marital status: Married    Spouse name: Not on file   Number of children: Not on file   Years of education: Not on file   Highest education level: Not on file  Occupational History   Not on file  Tobacco Use   Smoking status: Former    Current packs/day: 0.00    Average packs/day: 1 pack/day for 30.0 years (30.0 ttl pk-yrs)    Types: Cigarettes    Start date: 01/02/1970    Quit date: 01/03/2000    Years since quitting: 22.7   Smokeless tobacco: Never  Vaping Use   Vaping status: Never Used  Substance and Sexual Activity   Alcohol use: No   Drug use: No   Sexual activity: Not on file  Other Topics Concern   Not on file  Social History Narrative   Not on file   Social Determinants of Health   Financial Resource Strain: Not on file  Food Insecurity: Not on file  Transportation Needs: Not on file  Physical Activity: Not on file  Stress: Not on file  Social Connections: Not on file    Review of Systems: Gen: Denies fever, chills, cold or flulike symptoms, presyncope, syncope. CV: Denies chest pain, palpitations. Resp: Denies dyspnea, cough. GI: See HPI Heme: See HPI  Physical Exam: BP 131/83 (BP Location: Right Arm, Patient Position: Sitting, Cuff Size: Normal)   Pulse 78   Temp (!) 96.8 F (36 C) (Temporal)   Ht 5\' 10"  (1.778 m)   Wt 196 lb 12.8 oz (89.3 kg)   SpO2 96%   BMI 28.24 kg/m  General:   Alert and oriented. No distress noted. Pleasant and cooperative.  Head:  Normocephalic and atraumatic. Eyes:  Conjuctiva clear without scleral icterus. Heart:  S1, S2 present without murmurs appreciated. Lungs:  Clear to auscultation bilaterally. No wheezes, rales, or rhonchi. No distress.   Abdomen:  +BS, soft, non-tender and non-distended. No rebound or guarding. No HSM or masses noted. Msk:  Symmetrical without gross deformities. Normal posture. Extremities:  Without edema. Neurologic:  Alert and  oriented x4 Psych:  Normal mood and affect.    Assessment:  72 year old male with history of chronic diarrhea felt to be related to metformin presenting today with acute diarrhea.  Acute diarrhea: Reported he had been doing well with 2 bowel movements per day, but  developed severe, watery diarrhea starting last night with more than "2 dozen" stools in the last 24 hours without BRBPR, melena, abdominal pain.  Denies recent antibiotics, sick contacts, medication changes.  He does drink well water and he had COVID a couple weeks ago.   Suspect we are dealing with infectious etiology.  May very well have acute viral gastroenteritis, but unable to rule out bacterial gastroenteritis.  Will go ahead and order stool studies, CBC, CMP.  Will also go ahead and screen for celiac disease and check TSH.    Plan:  CBC, BMP, TSH, IgA, TTG IgA, C. difficile GDH and toxin A/B, Giardia, Cryptosporidium, GI pathogen panel. Bland diet. Focus on hydration with fluids that contain electrolytes. Further recommendations to follow.   Ermalinda Memos, PA-C [facility]enter[facility] 09/06/2022

## 2022-09-06 NOTE — Patient Instructions (Signed)
Please have blood work and studies completed at [facility].  We will call you with results and further recommendations.  For now, follow a bland diet and drink plenty of fluids that contain electrolyte such as Gatorade, G2, Pedialyte.  It was nice to meet you today!   Ermalinda Memos, PA-C [facility]astroenter[facility]

## 2022-09-07 ENCOUNTER — Other Ambulatory Visit: Payer: Self-pay | Admitting: Gastroenterology

## 2022-09-07 DIAGNOSIS — R197 Diarrhea, unspecified: Secondary | ICD-10-CM | POA: Diagnosis not present

## 2022-09-07 DIAGNOSIS — D6489 Other specified anemias: Secondary | ICD-10-CM | POA: Diagnosis not present

## 2022-09-10 ENCOUNTER — Telehealth: Payer: Self-pay | Admitting: Gastroenterology

## 2022-09-10 ENCOUNTER — Encounter: Payer: Self-pay | Admitting: Gastroenterology

## 2022-09-10 NOTE — Telephone Encounter (Signed)
Willie Oneal, can you let patient know that I would like to update CBC, iron panel, and B12 as I noted that he had low iron saturation and low normal B12 levels back in June of last year.   Please arrange CBC, iron panel, and B12.  Dx: Anemia, iron deficiency

## 2022-09-11 ENCOUNTER — Encounter: Payer: Self-pay | Admitting: *Deleted

## 2022-09-12 ENCOUNTER — Other Ambulatory Visit: Payer: Self-pay | Admitting: Gastroenterology

## 2022-09-12 DIAGNOSIS — D509 Iron deficiency anemia, unspecified: Secondary | ICD-10-CM | POA: Insufficient documentation

## 2022-09-12 LAB — C. DIFFICILE GDH AND TOXIN A/B
GDH ANTIGEN: NOT DETECTED
MICRO NUMBER:: 15431060
SPECIMEN QUALITY:: ADEQUATE
TOXIN A AND B: NOT DETECTED

## 2022-09-12 LAB — CRYPTOSPORIDIUM ANTIGEN, EIA
Specimen Quality:: ADEQUATE
micro Number:: 15428233

## 2022-09-12 LAB — GASTROINTESTINAL PATHOGEN PNL
CampyloBacter Group: NOT DETECTED
Norovirus GI/GII: NOT DETECTED
Rotavirus A: NOT DETECTED
Salmonella species: NOT DETECTED
Shiga Toxin 1: NOT DETECTED
Shiga Toxin 2: NOT DETECTED
Shigella Species: NOT DETECTED
Vibrio Group: NOT DETECTED
Yersinia enterocolitica: NOT DETECTED

## 2022-09-12 LAB — GIARDIA ANTIGEN
MICRO NUMBER:: 15431154
RESULT:: NOT DETECTED
SPECIMEN QUALITY:: ADEQUATE

## 2022-09-13 ENCOUNTER — Other Ambulatory Visit (HOSPITAL_COMMUNITY): Payer: Self-pay | Admitting: Internal Medicine

## 2022-09-13 ENCOUNTER — Encounter (HOSPITAL_COMMUNITY): Payer: Self-pay | Admitting: Internal Medicine

## 2022-09-13 DIAGNOSIS — R197 Diarrhea, unspecified: Secondary | ICD-10-CM | POA: Diagnosis not present

## 2022-09-13 DIAGNOSIS — R0781 Pleurodynia: Secondary | ICD-10-CM

## 2022-09-13 DIAGNOSIS — M542 Cervicalgia: Secondary | ICD-10-CM | POA: Diagnosis not present

## 2022-09-13 NOTE — Telephone Encounter (Signed)
See result note.  

## 2022-09-15 ENCOUNTER — Ambulatory Visit (HOSPITAL_COMMUNITY)
Admission: RE | Admit: 2022-09-15 | Discharge: 2022-09-15 | Disposition: A | Discharge status: Home / Self Care | Payer: Medicare Other | Source: Ambulatory Visit | Attending: Internal Medicine | Admitting: Internal Medicine

## 2022-09-15 DIAGNOSIS — R0781 Pleurodynia: Secondary | ICD-10-CM | POA: Diagnosis not present

## 2022-09-26 ENCOUNTER — Encounter: Payer: Self-pay | Admitting: *Deleted

## 2022-09-28 DIAGNOSIS — R197 Diarrhea, unspecified: Secondary | ICD-10-CM | POA: Diagnosis not present

## 2022-09-28 DIAGNOSIS — D509 Iron deficiency anemia, unspecified: Secondary | ICD-10-CM | POA: Diagnosis not present

## 2022-09-28 LAB — CBC WITH DIFFERENTIAL/PLATELET
Absolute Monocytes: 524 cells/uL (ref 200–950)
Basophils Absolute: 27 cells/uL (ref 0–200)
Basophils Relative: 0.4 %
Eosinophils Absolute: 170 cells/uL (ref 15–500)
Eosinophils Relative: 2.5 %
HCT: 48 % (ref 38.5–50.0)
Hemoglobin: 15 g/dL (ref 13.2–17.1)
Lymphs Abs: 1224 cells/uL (ref 850–3900)
MCH: 25.5 pg — ABNORMAL LOW (ref 27.0–33.0)
MCHC: 31.3 g/dL — ABNORMAL LOW (ref 32.0–36.0)
MCV: 81.6 fL (ref 80.0–100.0)
MPV: 10.5 fL (ref 7.5–12.5)
Monocytes Relative: 7.7 %
Neutro Abs: 4855 cells/uL (ref 1500–7800)
Neutrophils Relative %: 71.4 %
Platelets: 162 10*3/uL (ref 140–400)
RBC: 5.88 10*6/uL — ABNORMAL HIGH (ref 4.20–5.80)
RDW: 16.7 % — ABNORMAL HIGH (ref 11.0–15.0)
Total Lymphocyte: 18 %
WBC: 6.8 10*3/uL (ref 3.8–10.8)

## 2022-09-29 ENCOUNTER — Telehealth: Payer: Self-pay | Admitting: Pharmacist

## 2022-09-29 LAB — IRON,TIBC AND FERRITIN PANEL
%SAT: 13 % — ABNORMAL LOW (ref 20–48)
Ferritin: 10 ng/mL — ABNORMAL LOW (ref 24–380)
Iron: 48 ug/dL — ABNORMAL LOW (ref 50–180)
TIBC: 382 ug/dL (ref 250–425)

## 2022-09-29 LAB — VITAMIN B12: Vitamin B-12: 225 pg/mL (ref 200–1100)

## 2022-09-29 NOTE — Progress Notes (Signed)
09/29/2022  Patient ID: Willie Fortes., male   DOB: September 07, 1950, 72 y.o.   MRN: [id number]    Reason for referral: medication assistance  Referral source: [employer] Patient Assistance List Referral medication(s): Marcelline Deist and Rybelsus Current insurance:[employer]  Medication Review Findings:  Both Jardiance and Marcelline Deist are on the Patient's medication list on the PCP's EMR.  Patient said he is not taking Jardiance.  Will send note to PCP.   Medication Assistance Findings:  Medication assistance needs identified: Patient confirmed He received [place] through [employer] last year.  He said he had not received anything in His email from them about re-enrollment.  [employer] has a new process for this year that involves the patient following an online process Since he said he did not receive the notice or likely discarded it, we will send him a paper application.     Additional medication assistance options reviewed with patient as warranted:  No other options identified  Plan: I will route patient assistance letter to [facility] pharmacy technician who will coordinate patient assistance program application process for medications listed above.  [facility] pharmacy technician will assist with obtaining all required documents from both patient and provider(s) and submit application(s) once completed.      Beecher Mcardle, PharmD, BCACP [facility] Clinical Pharmacist [phone]

## 2022-09-30 LAB — BASIC METABOLIC PANEL
BUN: 17 mg/dL (ref 7–25)
CO2: 20 mmol/L (ref 20–32)
Calcium: 9 mg/dL (ref 8.6–10.3)
Chloride: 107 mmol/L (ref 98–110)
Creat: 1.13 mg/dL (ref 0.70–1.35)
Glucose, Bld: 283 mg/dL — ABNORMAL HIGH (ref 65–99)
Potassium: 4.2 mmol/L (ref 3.5–5.3)
Sodium: 137 mmol/L (ref 135–146)

## 2022-09-30 LAB — IGA: Immunoglobulin A: 143 mg/dL (ref 70–320)

## 2022-09-30 LAB — TISSUE TRANSGLUTAMINASE, IGA: (tTG) Ab, IgA: <1 U/mL

## 2022-09-30 LAB — TSH: TSH: 1.26 m[IU]/L (ref 0.40–4.50)

## 2022-10-09 ENCOUNTER — Telehealth: Payer: Self-pay | Admitting: Gastroenterology

## 2022-10-09 NOTE — Telephone Encounter (Signed)
Spoke to pt, informed him to take iron over the counter and b12. Pt voiced understanding.

## 2022-10-09 NOTE — Telephone Encounter (Signed)
Pt left a message that he was to get some over the counter meds and he can't remember what they were.  Asked if we could call him back.

## 2022-10-13 ENCOUNTER — Telehealth: Payer: Self-pay | Admitting: Pharmacy Technician

## 2022-10-13 DIAGNOSIS — Z5986 Financial insecurity: Secondary | ICD-10-CM

## 2022-10-13 NOTE — Progress Notes (Signed)
[facility] Customer service manager [facility])                                            [facility] Quality Pharmacy Team    10/13/2022  Willie Oneal 05-11-1950 [id number]                                      Medication Assistance Referral  Referral From: [facility] Katina B.  Medication/Company: Marcelline Deist / [employer] Patient application portion:  Mailed Provider application portion: Faxed  to Dr. Fidel Levy Provider address/fax verified via: Office website  Pattricia Boss, CPhT [place]  Office: [phone] Fax: [phone] Email: [email or web address]

## 2022-10-18 ENCOUNTER — Encounter: Payer: Self-pay | Admitting: *Deleted

## 2022-10-18 ENCOUNTER — Other Ambulatory Visit: Payer: Self-pay | Admitting: *Deleted

## 2022-10-18 MED ORDER — PEG 3350-KCL-NA BICARB-NACL 420 G PO SOLR: 4000.0000 mL | Freq: Once | ORAL | 0 refills | Status: AC

## 2022-10-22 ENCOUNTER — Other Ambulatory Visit: Payer: Self-pay | Admitting: Internal Medicine

## 2022-11-03 ENCOUNTER — Ambulatory Visit: Payer: Medicare Other | Admitting: Urology

## 2022-11-13 ENCOUNTER — Telehealth: Payer: Self-pay | Admitting: Urology

## 2022-11-13 NOTE — Telephone Encounter (Signed)
Patient needs order for Ultrasound put in before his next appt with Mckenzie.

## 2022-11-14 NOTE — Telephone Encounter (Signed)
Ok to order US?

## 2022-11-21 ENCOUNTER — Other Ambulatory Visit: Payer: Self-pay

## 2022-11-21 ENCOUNTER — Encounter: Payer: Self-pay | Admitting: *Deleted

## 2022-11-21 NOTE — Progress Notes (Signed)
Opened in error

## 2022-11-21 NOTE — Telephone Encounter (Signed)
I confirmed with MD to clarify need for [place], verbal from Dr. Ronne Binning no [place] is needed at this time.  Patient will need to follow up as scheduled with a PVR.  Patient notified of MD response.

## 2022-11-23 ENCOUNTER — Encounter (HOSPITAL_COMMUNITY)
Admission: RE | Admit: 2022-11-23 | Discharge: 2022-11-23 | Disposition: A | Discharge status: Home / Self Care | Payer: Medicare Other | Source: Ambulatory Visit | Attending: Internal Medicine | Admitting: Internal Medicine

## 2022-11-23 ENCOUNTER — Encounter (HOSPITAL_COMMUNITY): Payer: Medicare Other

## 2022-11-23 DIAGNOSIS — Z0000 Encounter for general adult medical examination without abnormal findings: Secondary | ICD-10-CM | POA: Diagnosis not present

## 2022-11-24 ENCOUNTER — Telehealth: Payer: Self-pay | Admitting: *Deleted

## 2022-11-24 NOTE — Telephone Encounter (Signed)
Pt called and stated he didn't stop taking iron until yesterday. Advised pt spoke with Dr.Carver and he says it was fine, just hold until after procedure. Pt verbalized understanding.

## 2022-11-27 ENCOUNTER — Ambulatory Visit (HOSPITAL_COMMUNITY): Payer: Medicare Other | Admitting: Anesthesiology

## 2022-11-27 ENCOUNTER — Encounter (HOSPITAL_COMMUNITY)
Admission: RE | Disposition: A | Discharge status: Home / Self Care | Payer: Self-pay | Source: Ambulatory Visit | Attending: Internal Medicine

## 2022-11-27 ENCOUNTER — Ambulatory Visit (HOSPITAL_COMMUNITY)
Admission: RE | Admit: 2022-11-27 | Discharge: 2022-11-27 | Disposition: A | Discharge status: Home / Self Care | Payer: Medicare Other | Source: Ambulatory Visit | Attending: Internal Medicine | Admitting: Internal Medicine

## 2022-11-27 ENCOUNTER — Encounter (HOSPITAL_COMMUNITY): Payer: Self-pay

## 2022-11-27 DIAGNOSIS — K635 Polyp of colon: Secondary | ICD-10-CM | POA: Diagnosis not present

## 2022-11-27 DIAGNOSIS — M109 Gout, unspecified: Secondary | ICD-10-CM | POA: Insufficient documentation

## 2022-11-27 DIAGNOSIS — D125 Benign neoplasm of sigmoid colon: Secondary | ICD-10-CM | POA: Diagnosis not present

## 2022-11-27 DIAGNOSIS — E611 Iron deficiency: Secondary | ICD-10-CM | POA: Diagnosis not present

## 2022-11-27 DIAGNOSIS — M1990 Unspecified osteoarthritis, unspecified site: Secondary | ICD-10-CM | POA: Diagnosis not present

## 2022-11-27 DIAGNOSIS — E785 Hyperlipidemia, unspecified: Secondary | ICD-10-CM | POA: Diagnosis not present

## 2022-11-27 DIAGNOSIS — D124 Benign neoplasm of descending colon: Secondary | ICD-10-CM | POA: Diagnosis not present

## 2022-11-27 DIAGNOSIS — K2289 Other specified disease of esophagus: Secondary | ICD-10-CM

## 2022-11-27 DIAGNOSIS — K529 Noninfective gastroenteritis and colitis, unspecified: Secondary | ICD-10-CM | POA: Insufficient documentation

## 2022-11-27 DIAGNOSIS — R194 Change in bowel habit: Secondary | ICD-10-CM | POA: Diagnosis not present

## 2022-11-27 DIAGNOSIS — K6389 Other specified diseases of intestine: Secondary | ICD-10-CM | POA: Diagnosis not present

## 2022-11-27 DIAGNOSIS — K648 Other hemorrhoids: Secondary | ICD-10-CM | POA: Insufficient documentation

## 2022-11-27 DIAGNOSIS — I10 Essential (primary) hypertension: Secondary | ICD-10-CM | POA: Diagnosis not present

## 2022-11-27 DIAGNOSIS — Z7984 Long term (current) use of oral hypoglycemic drugs: Secondary | ICD-10-CM | POA: Insufficient documentation

## 2022-11-27 DIAGNOSIS — Z79899 Other long term (current) drug therapy: Secondary | ICD-10-CM | POA: Insufficient documentation

## 2022-11-27 DIAGNOSIS — R197 Diarrhea, unspecified: Secondary | ICD-10-CM | POA: Diagnosis not present

## 2022-11-27 DIAGNOSIS — D120 Benign neoplasm of cecum: Secondary | ICD-10-CM | POA: Insufficient documentation

## 2022-11-27 DIAGNOSIS — K317 Polyp of stomach and duodenum: Secondary | ICD-10-CM | POA: Diagnosis not present

## 2022-11-27 DIAGNOSIS — K219 Gastro-esophageal reflux disease without esophagitis: Secondary | ICD-10-CM | POA: Insufficient documentation

## 2022-11-27 DIAGNOSIS — K2970 Gastritis, unspecified, without bleeding: Secondary | ICD-10-CM | POA: Insufficient documentation

## 2022-11-27 DIAGNOSIS — Z8546 Personal history of malignant neoplasm of prostate: Secondary | ICD-10-CM | POA: Insufficient documentation

## 2022-11-27 DIAGNOSIS — D509 Iron deficiency anemia, unspecified: Secondary | ICD-10-CM

## 2022-11-27 DIAGNOSIS — E119 Type 2 diabetes mellitus without complications: Secondary | ICD-10-CM | POA: Diagnosis not present

## 2022-11-27 DIAGNOSIS — Z87891 Personal history of nicotine dependence: Secondary | ICD-10-CM | POA: Insufficient documentation

## 2022-11-27 HISTORY — PX: ESOPHAGOGASTRODUODENOSCOPY (EGD) WITH PROPOFOL: SHX5813

## 2022-11-27 HISTORY — PX: BIOPSY: SHX5522

## 2022-11-27 HISTORY — PX: POLYPECTOMY: SHX5525

## 2022-11-27 HISTORY — PX: COLONOSCOPY WITH PROPOFOL: SHX5780

## 2022-11-27 LAB — GLUCOSE, CAPILLARY: Glucose-Capillary: 148 mg/dL — ABNORMAL HIGH (ref 70–99)

## 2022-11-27 SURGERY — COLONOSCOPY WITH PROPOFOL
Anesthesia: General

## 2022-11-27 MED ORDER — LIDOCAINE HCL (CARDIAC) PF 100 MG/5ML IV SOSY
PREFILLED_SYRINGE | INTRAVENOUS | Status: DC | PRN
  Administered 2022-11-27: 50 mg via INTRAVENOUS

## 2022-11-27 MED ORDER — PHENYLEPHRINE HCL (PRESSORS) 10 MG/ML IV SOLN
INTRAVENOUS | Status: DC | PRN
  Administered 2022-11-27 (×3): 100 ug via INTRAVENOUS

## 2022-11-27 MED ORDER — PROPOFOL 10 MG/ML IV BOLUS
INTRAVENOUS | Status: DC | PRN
  Administered 2022-11-27: 100 mg via INTRAVENOUS

## 2022-11-27 MED ORDER — PROPOFOL 500 MG/50ML IV EMUL
INTRAVENOUS | Status: DC | PRN
  Administered 2022-11-27: 150 ug/kg/min via INTRAVENOUS

## 2022-11-27 MED ORDER — LACTATED RINGERS IV SOLN: INTRAVENOUS | Status: DC | PRN

## 2022-11-27 NOTE — Anesthesia Preprocedure Evaluation (Signed)
Anesthesia Evaluation  Patient identified by MRN, date of birth, ID band Patient awake    Reviewed: Allergy & Precautions, NPO status , Patient's Chart, lab work & pertinent test results  Airway Mallampati: I  TM Distance: >3 FB Neck ROM: Full    Dental no notable dental hx. (+) Teeth Intact, Dental Advisory Given   Pulmonary former smoker   Pulmonary exam normal breath sounds clear to auscultation       Cardiovascular hypertension, Pt. on medications Normal cardiovascular exam Rhythm:Regular Rate:Normal     Neuro/Psych negative neurological ROS  negative psych ROS   GI/Hepatic ,GERD  ,,  Endo/Other  diabetes, Type 2, Oral Hypoglycemic Agents    Renal/GU Renal disease   Prostate CA    Musculoskeletal  (+) Arthritis ,    Abdominal   Peds  Hematology   Anesthesia Other Findings All: PCN  Reproductive/Obstetrics                             Anesthesia Physical Anesthesia Plan  ASA: 3  Anesthesia Plan: General   Post-op Pain Management: Minimal or no pain anticipated   Induction: Intravenous  PONV Risk Score and Plan: 3  Airway Management Planned: Natural Airway and Nasal Cannula  Additional Equipment: None  Intra-op Plan:   Post-operative Plan: Extubation in OR  Informed Consent: I have reviewed the patients History and Physical, chart, labs and discussed the procedure including the risks, benefits and alternatives for the proposed anesthesia with the patient or authorized representative who has indicated his/her understanding and acceptance.     Dental advisory given  Plan Discussed with: CRNA  Anesthesia Plan Comments:         Anesthesia Quick Evaluation

## 2022-11-27 NOTE — Discharge Instructions (Addendum)
EGD Discharge instructions Please read the instructions outlined below and refer to this sheet in the next few weeks. These discharge instructions provide you with general information on caring for yourself after you leave the hospital. Your doctor may also give you specific instructions. While your treatment has been planned according to the most current medical practices available, unavoidable complications occasionally occur. If you have any problems or questions after discharge, please call your doctor. ACTIVITY You may resume your regular activity but move at a slower pace for the next 24 hours.  Take frequent rest periods for the next 24 hours.  Walking will help expel (get rid of) the air and reduce the bloated feeling in your abdomen.  No driving for 24 hours (because of the anesthesia (medicine) used during the test).  You may shower.  Do not sign any important legal documents or operate any machinery for 24 hours (because of the anesthesia used during the test).  NUTRITION Drink plenty of fluids.  You may resume your normal diet.  Begin with a light meal and progress to your normal diet.  Avoid alcoholic beverages for 24 hours or as instructed by your caregiver.  MEDICATIONS You may resume your normal medications unless your caregiver tells you otherwise.  WHAT YOU CAN EXPECT TODAY You may experience abdominal discomfort such as a feeling of fullness or "gas" pains.  FOLLOW-UP Your doctor will discuss the results of your test with you.  SEEK IMMEDIATE MEDICAL ATTENTION IF ANY OF THE FOLLOWING OCCUR: Excessive nausea (feeling sick to your stomach) and/or vomiting.  Severe abdominal pain and distention (swelling).  Trouble swallowing.  Temperature over 101 F (37.8 C).  Rectal bleeding or vomiting of blood.     Colonoscopy Discharge Instructions  Read the instructions outlined below and refer to this sheet in the next few weeks. These discharge instructions provide you with  general information on caring for yourself after you leave the hospital. Your doctor may also give you specific instructions. While your treatment has been planned according to the most current medical practices available, unavoidable complications occasionally occur.   ACTIVITY You may resume your regular activity, but move at a slower pace for the next 24 hours.  Take frequent rest periods for the next 24 hours.  Walking will help get rid of the air and reduce the bloated feeling in your belly (abdomen).  No driving for 24 hours (because of the medicine (anesthesia) used during the test).   Do not sign any important legal documents or operate any machinery for 24 hours (because of the anesthesia used during the test).  NUTRITION Drink plenty of fluids.  You may resume your normal diet as instructed by your doctor.  Begin with a light meal and progress to your normal diet. Heavy or fried foods are harder to digest and may make you feel sick to your stomach (nauseated).  Avoid alcoholic beverages for 24 hours or as instructed.  MEDICATIONS You may resume your normal medications unless your doctor tells you otherwise.  WHAT YOU CAN EXPECT TODAY Some feelings of bloating in the abdomen.  Passage of more gas than usual.  Spotting of blood in your stool or on the toilet paper.  IF YOU HAD POLYPS REMOVED DURING THE COLONOSCOPY: No aspirin products for 7 days or as instructed.  No alcohol for 7 days or as instructed.  Eat a soft diet for the next 24 hours.  FINDING OUT THE RESULTS OF YOUR TEST Not all test results are  available during your visit. If your test results are not back during the visit, make an appointment with your caregiver to find out the results. Do not assume everything is normal if you have not heard from your caregiver or the medical facility. It is important for you to follow up on all of your test results.  SEEK IMMEDIATE MEDICAL ATTENTION IF: You have more than a spotting of  blood in your stool.  Your belly is swollen (abdominal distention).  You are nauseated or vomiting.  You have a temperature over 101.  You have abdominal pain or discomfort that is severe or gets worse throughout the day.   Your EGD revealed mild amount inflammation in your stomach.  I took biopsies of this to rule out infection with a bacteria called H. pylori.  I also took samples of your esophagus. Small bowel appeared normal. Continue on Pantoprazole.   Your colonoscopy revealed 3 polyp(s) which I removed successfully.  I recommend repeating colonoscopy in 5 years for surveillance purposes.   Overall, your colon appeared very healthy.  I did not see any active inflammation indicative of underlying inflammatory bowel disease such as Crohn's disease or ulcerative colitis throughout your colon.  I took biopsies of your colon to further evaluate.    Await pathology results, my office will contact you.  Follow up in GI office in 2-3 months  I hope you have a great rest of your week!  Hennie Duos. Marletta Lor, D.O. Gastroenterology and Hepatology [facility]

## 2022-11-27 NOTE — Op Note (Signed)
[facility] Patient Name: Willie Oneal Procedure Date: 11/27/2022 1:24 PM MRN: [id number] Date of Birth: Nov 08, 1950 Attending MD: Hennie Duos. Marletta Lor , [place], [phone] CSN: [id number] Age: 72 Admit Type: Outpatient Procedure:                Upper GI endoscopy Indications:              Heartburn, Diarrhea, iron deficiency Providers:                Hennie Duos. Marletta Lor, DO, Angelica Ran, Lennice Sites                            Technician, Technician Referring MD:              Medicines:                See the Anesthesia note for documentation of the                            administered medications Complications:            No immediate complications. Estimated Blood Loss:     Estimated blood loss was minimal. Procedure:                Pre-Anesthesia Assessment:                           - The anesthesia plan was to use monitored                            anesthesia care (MAC).                           After obtaining informed consent, the endoscope was                            passed under direct vision. Throughout the                            procedure, the patient's blood pressure, pulse, and                            oxygen saturations were monitored continuously. The                            [id number]-H190 ([id number]) scope was introduced through the                            mouth, and advanced to the second part of duodenum.                            The upper GI endoscopy was accomplished without                            difficulty. The patient tolerated the procedure                            well. Scope In:  1:35:17 PM Scope Out: 1:40:24 PM Total Procedure Duration: 0 hours 5 minutes 7 seconds  Findings:      The Z-line was irregular. Biopsies were taken with a cold forceps for       histology.      Patchy mild inflammation characterized by erythema was found in the       entire examined stomach. Biopsies were taken with a cold forceps for       Helicobacter pylori  testing.      Multiple small sessile polyps (all <1 cm) with no bleeding and no       stigmata of recent bleeding were found in the gastric body. Likely       fundic gland..      The duodenal bulb, first portion of the duodenum and second portion of       the duodenum were normal. Impression:               - Z-line irregular. Biopsied.                           - Gastritis. Biopsied.                           - Normal duodenal bulb, first portion of the                            duodenum and second portion of the duodenum. Moderate Sedation:      Per Anesthesia Care Recommendation:           - Patient has a contact number available for                            emergencies. The signs and symptoms of potential                            delayed complications were discussed with the                            patient. Return to normal activities tomorrow.                            Written discharge instructions were provided to the                            patient.                           - Resume previous diet.                           - Continue present medications.                           - Await pathology results.                           - Use a proton pump inhibitor PO BID.                           -  Return to GI clinic in 3 months. Procedure Code(s):        --- Professional ---                           [phone], Esophagogastroduodenoscopy, flexible,                            transoral; with biopsy, single or multiple Diagnosis Code(s):        --- Professional ---                           K22.89, Other specified disease of esophagus                           K29.70, Gastritis, unspecified, without bleeding                           R12, Heartburn                           R19.7, Diarrhea, unspecified CPT copyright 2022 American Medical Association. All rights reserved. The codes documented in this report are preliminary and upon coder review may  be revised to meet  current compliance requirements. Hennie Duos. Marletta Lor, DO Hennie Duos. Marletta Lor, DO 11/27/2022 1:43:10 PM This report has been signed electronically. Number of Addenda: 0

## 2022-11-27 NOTE — Anesthesia Postprocedure Evaluation (Signed)
Anesthesia Post Note  Patient: Willie Oneal.  Procedure(s) Performed: COLONOSCOPY WITH PROPOFOL ESOPHAGOGASTRODUODENOSCOPY (EGD) WITH PROPOFOL BIOPSY POLYPECTOMY  Patient location during evaluation: PACU Anesthesia Type: General Level of consciousness: awake and alert Pain management: pain level controlled Vital Signs Assessment: post-procedure vital signs reviewed and stable Respiratory status: spontaneous breathing, nonlabored ventilation, respiratory function stable and patient connected to nasal cannula oxygen Cardiovascular status: blood pressure returned to baseline and stable Postop Assessment: no apparent nausea or vomiting Anesthetic complications: no   There were no known notable events for this encounter.   Last Vitals:  Vitals:   11/27/22 1405 11/27/22 1412  BP: (!) 99/41 122/76  Pulse: 77 72  Resp: 16 16  Temp: (!) 36.4 C   SpO2: 97% 99%    Last Pain:  Vitals:   11/27/22 1412  TempSrc:   PainSc: 0-No pain                 Leidy Massar L Donia Yokum

## 2022-11-27 NOTE — H&P (Addendum)
Primary Care Physician:  Benita Stabile, MD Primary Gastroenterologist:  Dr. Marletta Lor  Pre-Procedure History & Physical: HPI:  Willie Oneal. is a 72 y.o. male is here or an EGD and colonoscopy to be performed for iron deficiency .   Past Medical History:  Diagnosis Date   Diabetes mellitus type 2    GERD (gastroesophageal reflux disease)    History of COVID-19    jan/feb 2020   History of COVID-19 01/2020   mild symptoms all symptoms all symptoms resolved   History of gout    none in years   History of kidney stones    passed on won at least 15 yrs ago per pt on 03-15-2021   HTN (hypertension)    Hyperlipidemia    OsteoArthritis    Prostate cancer [facility])     Past Surgical History:  Procedure Laterality Date   COLONOSCOPY N/A 07/21/2015   Procedure: COLONOSCOPY;  Surgeon: Malissa Hippo, MD;  [facility];  Service: Endoscopy;  Laterality: N/A;  730   CYSTOSCOPY N/A 03/17/2021   Procedure: CYSTOSCOPY;  Surgeon: Malen Gauze, MD;  Location: [facility];  Service: Urology;  Laterality: N/A;   left shoudler surgery     in high school result of football accident   RADIOACTIVE SEED IMPLANT N/A 03/17/2021   Procedure: RADIOACTIVE SEED IMPLANT/BRACHYTHERAPY IMPLANT;  Surgeon: Malen Gauze, MD;  Location: [facility];  Service: Urology;  Laterality: N/A;   right shoulder arthroplasty  1999   right shoulder exploration and implantation of antibiotic spacer 05-02-2010     TONSILLECTOMY  1963   and adenoids   vasectomy  1995    Prior to Admission medications   Medication Sig Start Date End Date Taking? Authorizing Provider  allopurinol (ZYLOPRIM) 100 MG tablet Take 100 mg by mouth daily.   Yes [provider]  dapagliflozin propanediol (FARXIGA) 10 MG TABS tablet Take by mouth daily.   Yes [provider]  glipiZIDE (GLUCOTROL XL) 10 MG 24 hr tablet Take 10 mg by mouth daily with breakfast.   Yes [provider]  Lancets (ONETOUCH DELICA PLUS LANCET30G) MISC  04/20/22  Yes [provider]  lisinopril (ZESTRIL) 20 MG tablet Take 20 mg by mouth daily.   Yes [provider]  metoprolol tartrate (LOPRESSOR) 50 MG tablet TAKE 1 TABLET BY MOUTH TWICE  DAILY 10/23/22  Yes Mallipeddi, Vishnu P, MD  ONETOUCH VERIO test strip 1 each 2 (two) times daily. 08/03/22  Yes [provider]  pantoprazole (PROTONIX) 20 MG tablet Take 20 mg by mouth 2 (two) times daily. 07/14/22  Yes [provider]  simvastatin (ZOCOR) 20 MG tablet Take 20 mg by mouth daily.   Yes [provider]  metFORMIN (GLUCOPHAGE-XR) 500 MG 24 hr tablet Take 1,000 mg by mouth 2 (two) times daily. 01/21/21   [provider]    Allergies as of 10/18/2022 - Review Complete 09/06/2022  Allergen Reaction Noted   Penicillins  03/14/2021    History reviewed. No pertinent family history.  Social History   Socioeconomic History   Marital status: Married    Spouse name: Not on file   Number of children: Not on file   Years of education: Not on file   Highest education level: Not on file  Occupational History   Not on file  Tobacco Use   Smoking status: Former    Current packs/day: 0.00    Average packs/day: 1  pack/day for 30.0 years (30.0 ttl pk-yrs)    Types: Cigarettes    Start date: 01/02/1970    Quit date: 01/03/2000    Years since quitting: 22.9   Smokeless tobacco: Never  Vaping Use   Vaping status: Never Used  Substance and Sexual Activity   Alcohol use: No   Drug use: No   Sexual activity: Not on file  Other Topics Concern   Not on file  Social History Narrative   Not on file   Social Determinants of Health   Financial Resource Strain: Not on file  Food Insecurity: Not on file  Transportation Needs: Not on file  Physical Activity: Not on file  Stress: Not on file  Social Connections: Not on file  Intimate Partner Violence: Not on file    Review of  Systems: General: Negative for fever, chills, fatigue, weakness. Eyes: Negative for vision changes.  ENT: Negative for hoarseness, difficulty swallowing , nasal congestion. CV: Negative for chest pain, angina, palpitations, dyspnea on exertion, peripheral edema.  Respiratory: Negative for dyspnea at rest, dyspnea on exertion, cough, sputum, wheezing.  GI: See history of present illness. GU:  Negative for dysuria, hematuria, urinary incontinence, urinary frequency, nocturnal urination.  MS: Negative for joint pain, low back pain.  Derm: Negative for rash or itching.  Neuro: Negative for weakness, abnormal sensation, seizure, frequent headaches, memory loss, confusion.  Psych: Negative for anxiety, depression Endo: Negative for unusual weight change.  Heme: Negative for bruising or bleeding. Allergy: Negative for rash or hives.  Physical Exam: Vital signs in last 24 hours: Temp:  [97.5 F (36.4 C)] 97.5 F (36.4 C) (11/25 1227) Pulse Rate:  [79] 79 (11/25 1227) Resp:  [20] 20 (11/25 1227) BP: (146)/(78) 146/78 (11/25 1227) SpO2:  [99 %] 99 % (11/25 1227) Weight:  [90.7 kg] 90.7 kg (11/25 1226)   General:   Alert,  Well-developed, well-nourished, pleasant and cooperative in NAD Head:  Normocephalic and atraumatic. Eyes:  Sclera clear, no icterus.   Conjunctiva pink. Ears:  Normal auditory acuity. Nose:  No deformity, discharge,  or lesions. Msk:  Symmetrical without gross deformities. Normal posture. Extremities:  Without clubbing or edema. Neurologic:  Alert and  oriented x4;  grossly normal neurologically. Skin:  Intact without significant lesions or rashes. Psych:  Alert and cooperative. Normal mood and affect.   Impression/Plan: Willie Oneal. is here for an EGD and colonoscopy to be performed for iron deficiency .   Risks, benefits, limitations, imponderables and alternatives regarding procedure have been reviewed with the patient. Questions have been answered. All  parties agreeable.

## 2022-11-27 NOTE — Transfer of Care (Signed)
Immediate Anesthesia Transfer of Care Note  Patient: Willie Oneal.  Procedure(s) Performed: COLONOSCOPY WITH PROPOFOL ESOPHAGOGASTRODUODENOSCOPY (EGD) WITH PROPOFOL BIOPSY POLYPECTOMY  Patient Location: Short Stay  Anesthesia Type:General  Level of Consciousness: awake, alert , oriented, and patient cooperative  Airway & Oxygen Therapy: Patient Spontanous Breathing  Post-op Assessment: Report given to RN, Post -op Vital signs reviewed and stable, and Patient moving all extremities X 4  Post vital signs: Reviewed and stable  Last Vitals:  Vitals Value Taken Time  BP 122/76 11/27/22 1412  Temp 36.4 C 11/27/22 1405  Pulse 72 11/27/22 1412  Resp 16 11/27/22 1412  SpO2 99 % 11/27/22 1412    Last Pain:  Vitals:   11/27/22 1412  TempSrc:   PainSc: 0-No pain         Complications: No notable events documented.

## 2022-11-27 NOTE — Op Note (Addendum)
[facility] Patient Name: Real Vandenbosch Procedure Date: 11/27/2022 1:18 PM MRN: [id number] Date of Birth: [birth year] Attending MD: Hennie Duos. Maple Mirza, [id number] CSN: [id number] Age: 72 Admit Type: Outpatient Procedure:                Colonoscopy Indications:              Chronic diarrhea, Iron deficiency Providers:                Hennie Duos. Marletta Lor, DO, Angelica Ran, Lennice Sites                            Technician, Technician Referring MD:              Medicines:                See the Anesthesia note for documentation of the                            administered medications Complications:            No immediate complications. Estimated Blood Loss:     Estimated blood loss was minimal. Procedure:                Pre-Anesthesia Assessment:                           - The anesthesia plan was to use monitored                            anesthesia care (MAC).                           After obtaining informed consent, the colonoscope                            was passed under direct vision. Throughout the                            procedure, the patient's blood pressure, pulse, and                            oxygen saturations were monitored continuously. The                            PCF-HQ190L ([id number]) scope was introduced through                            the anus and advanced to the the cecum, identified                            by appendiceal orifice and ileocecal valve. The                            colonoscopy was performed without difficulty. The                            patient tolerated the procedure well. The  quality                            of the bowel preparation was evaluated using the                            BBPS [facility] Bowel Preparation Scale) with scores                            of: Right Colon = 3, Transverse Colon = 3 and Left                            Colon = 3 (entire mucosa seen well with no residual                             staining, small fragments of stool or opaque                            liquid). The total BBPS score equals 9. Scope In: 1:46:45 PM Scope Out: 2:02:44 PM Scope Withdrawal Time: 0 hours 12 minutes 16 seconds  Total Procedure Duration: 0 hours 15 minutes 59 seconds  Findings:      Non-bleeding internal hemorrhoids were found during endoscopy.      A 5 mm polyp was found in the cecum. The polyp was sessile. The polyp       was removed with a cold snare. Resection and retrieval were complete.      Two sessile polyps were found in the sigmoid colon and descending colon.       The polyps were 4 to 6 mm in size. These polyps were removed with a cold       snare. Resection and retrieval were complete.      Biopsies for histology were taken with a cold forceps from the ascending       colon, transverse colon and descending colon for evaluation of       microscopic colitis.      The exam was otherwise without abnormality. Impression:               - Non-bleeding internal hemorrhoids.                           - One 5 mm polyp in the cecum, removed with a cold                            snare. Resected and retrieved.                           - Two 4 to 6 mm polyps in the sigmoid colon and in                            the descending colon, removed with a cold snare.                            Resected and retrieved.                           -  The examination was otherwise normal.                           - Biopsies were taken with a cold forceps from the                            ascending colon, transverse colon and descending                            colon for evaluation of microscopic colitis. Moderate Sedation:      Per Anesthesia Care Recommendation:           - Patient has a contact number available for                            emergencies. The signs and symptoms of potential                            delayed complications were discussed with the                             patient. Return to normal activities tomorrow.                            Written discharge instructions were provided to the                            patient.                           - Resume previous diet.                           - Continue present medications.                           - Await pathology results.                           - Repeat colonoscopy in 5 years for surveillance.                           - Return to GI clinic in 3 months. Procedure Code(s):        --- Professional ---                           [id number], Colonoscopy, flexible; with removal of                            tumor(s), polyp(s), or other lesion(s) by snare                            technique                           45380, 59, Colonoscopy, flexible; with biopsy,  single or multiple Diagnosis Code(s):        --- Professional ---                           D12.0, Benign neoplasm of cecum                           D12.5, Benign neoplasm of sigmoid colon                           D12.4, Benign neoplasm of descending colon                           K64.8, Other hemorrhoids                           K52.9, Noninfective gastroenteritis and colitis,                            unspecified CPT copyright 2022 American Medical Association. All rights reserved. The codes documented in this report are preliminary and upon coder review may  be revised to meet current compliance requirements. Hennie Duos. Marletta Lor, DO Hennie Duos. Marletta Lor, DO 11/27/2022 2:05:45 PM This report has been signed electronically. Number of Addenda: 0

## 2022-11-29 LAB — SURGICAL PATHOLOGY

## 2022-12-04 ENCOUNTER — Encounter (HOSPITAL_COMMUNITY): Payer: Self-pay | Admitting: Internal Medicine

## 2023-01-02 ENCOUNTER — Other Ambulatory Visit (HOSPITAL_COMMUNITY): Payer: Self-pay

## 2023-01-04 ENCOUNTER — Other Ambulatory Visit (HOSPITAL_COMMUNITY): Payer: Self-pay

## 2023-01-04 DIAGNOSIS — J069 Acute upper respiratory infection, unspecified: Secondary | ICD-10-CM | POA: Diagnosis not present

## 2023-01-04 DIAGNOSIS — Z87891 Personal history of nicotine dependence: Secondary | ICD-10-CM | POA: Diagnosis not present

## 2023-01-04 DIAGNOSIS — Z860100 Personal history of colon polyps, unspecified: Secondary | ICD-10-CM | POA: Diagnosis not present

## 2023-01-05 ENCOUNTER — Other Ambulatory Visit (HOSPITAL_COMMUNITY): Payer: Self-pay

## 2023-01-11 ENCOUNTER — Other Ambulatory Visit (HOSPITAL_COMMUNITY): Payer: Self-pay

## 2023-01-11 MED ORDER — GLUCOSE BLOOD VI STRP: 1.0000 | ORAL_STRIP | Freq: Two times a day (BID) | 5 refills | Status: DC

## 2023-01-11 MED ORDER — GLIPIZIDE ER 10 MG PO TB24: 10.0000 mg | ORAL_TABLET | Freq: Two times a day (BID) | ORAL | 3 refills | Status: AC

## 2023-01-11 MED ORDER — LISINOPRIL 20 MG PO TABS: 20.0000 mg | ORAL_TABLET | Freq: Every day | ORAL | 3 refills | Status: DC

## 2023-01-11 MED ORDER — PANTOPRAZOLE SODIUM 20 MG PO TBEC: 20.0000 mg | DELAYED_RELEASE_TABLET | Freq: Two times a day (BID) | ORAL | 3 refills | Status: DC

## 2023-01-11 MED ORDER — ONETOUCH DELICA PLUS LANCET30G MISC: 1.0000 | Freq: Two times a day (BID) | 2 refills | Status: AC

## 2023-01-11 MED ORDER — ALLOPURINOL 100 MG PO TABS: 100.0000 mg | ORAL_TABLET | Freq: Every day | ORAL | 3 refills | Status: AC

## 2023-01-15 ENCOUNTER — Other Ambulatory Visit (HOSPITAL_COMMUNITY): Payer: Self-pay

## 2023-01-18 ENCOUNTER — Other Ambulatory Visit (HOSPITAL_COMMUNITY): Payer: Self-pay

## 2023-01-22 ENCOUNTER — Telehealth: Payer: Self-pay

## 2023-01-22 ENCOUNTER — Ambulatory Visit: Payer: HMO | Attending: Internal Medicine

## 2023-01-22 DIAGNOSIS — I493 Ventricular premature depolarization: Secondary | ICD-10-CM

## 2023-01-22 NOTE — Telephone Encounter (Signed)
Spoke to pt who verbalized understanding that monitor will be shipped to his home. Pt had no questions at this time regarding monitor.

## 2023-01-22 NOTE — Telephone Encounter (Signed)
-----   Message from [facility] B sent at 07/20/2022 11:42 AM EDT ----- Regarding: Zio Monitor Zio Monitor- 28m- 2weeks- VM- PVC's

## 2023-01-24 ENCOUNTER — Other Ambulatory Visit: Payer: Self-pay

## 2023-01-24 ENCOUNTER — Other Ambulatory Visit (HOSPITAL_COMMUNITY): Payer: Self-pay

## 2023-01-24 MED ORDER — SIMVASTATIN 40 MG PO TABS
40.0000 mg | ORAL_TABLET | Freq: Every day | ORAL | 2 refills | Status: AC
  Filled 2023-01-24: qty 90, 90d supply, fill #0
  Filled 2023-07-23: qty 90, 90d supply, fill #1
  Filled 2023-11-26: qty 90, 90d supply, fill #2

## 2023-01-24 MED ORDER — METFORMIN HCL ER 500 MG PO TB24
1000.0000 mg | ORAL_TABLET | Freq: Two times a day (BID) | ORAL | 1 refills | Status: DC
  Filled 2023-01-24: qty 360, 90d supply, fill #0

## 2023-01-24 MED ORDER — PANTOPRAZOLE SODIUM 20 MG PO TBEC
20.0000 mg | DELAYED_RELEASE_TABLET | Freq: Two times a day (BID) | ORAL | 0 refills | Status: DC
  Filled 2023-01-24: qty 180, 90d supply, fill #0

## 2023-01-24 MED ORDER — LISINOPRIL 20 MG PO TABS
20.0000 mg | ORAL_TABLET | Freq: Every day | ORAL | 3 refills | Status: DC
  Filled 2023-01-24: qty 90, 90d supply, fill #0
  Filled 2023-05-02: qty 90, 90d supply, fill #1

## 2023-01-24 MED ORDER — GLIPIZIDE ER 10 MG PO TB24
10.0000 mg | ORAL_TABLET | Freq: Two times a day (BID) | ORAL | 0 refills | Status: DC
  Filled 2023-01-24: qty 180, 90d supply, fill #0

## 2023-01-24 MED ORDER — ALLOPURINOL 100 MG PO TABS
100.0000 mg | ORAL_TABLET | Freq: Every day | ORAL | 3 refills | Status: DC
  Filled 2023-01-24: qty 90, 90d supply, fill #0
  Filled 2023-05-02: qty 90, 90d supply, fill #1

## 2023-01-28 DIAGNOSIS — I493 Ventricular premature depolarization: Secondary | ICD-10-CM

## 2023-01-30 NOTE — Telephone Encounter (Signed)
error

## 2023-01-31 ENCOUNTER — Ambulatory Visit: Payer: HMO | Admitting: Urology

## 2023-02-01 ENCOUNTER — Other Ambulatory Visit (HOSPITAL_COMMUNITY): Payer: Self-pay

## 2023-02-01 ENCOUNTER — Telehealth: Payer: Self-pay | Admitting: Pharmacy Technician

## 2023-02-01 ENCOUNTER — Encounter: Payer: Self-pay | Admitting: Gastroenterology

## 2023-02-01 DIAGNOSIS — Z5986 Financial insecurity: Secondary | ICD-10-CM

## 2023-02-01 NOTE — Progress Notes (Addendum)
Pharmacy Medication Assistance Program Note    02/01/2023  Patient ID: Willie Oneal., male   DOB: [birth year], 73 y.o.   MRN: [id number]     02/01/2023  Outreach Medication One  Initial Outreach Date (Medication One) 10/10/2022  Manufacturer Medication One [employer]  [employer] Farxiga  Dose of Farxiga 10mg   Type of Radiographer, therapeutic Assistance  Date Application Sent to Patient 10/12/2022  Application Items Requested Application;Proof of Income;Other  Date Application Sent to Prescriber 10/12/2022  Name of Prescriber Nita Sells    Care coordination call placed to [employer] in regard to [facility] application. Per IVR system, they need more information. Faxed provider part to [employer]. Successful outreach to patient. HIPAA verified. Patient informs he remembers receiving the application but did not send it back because he informs that household income is high and he did not think he would qualify. Based on reported income, patient is around the 470th percentile of the Federal Poverty Level. The requirement of the program state the income must be at or below 300%FPL. His household income as reported would make him over that amount. Will f/u with [employer] next week after having faxed in the MD part just to confirm he is not enrolled in the program.   Willie Oneal, CPhT [place]  Office: [phone] Fax: [phone] Email: [email or web address]

## 2023-02-06 ENCOUNTER — Other Ambulatory Visit (HOSPITAL_COMMUNITY): Payer: Self-pay

## 2023-02-06 ENCOUNTER — Telehealth: Payer: Self-pay | Admitting: Pharmacy Technician

## 2023-02-06 DIAGNOSIS — Z5986 Financial insecurity: Secondary | ICD-10-CM

## 2023-02-06 NOTE — Progress Notes (Signed)
 Pharmacy Medication Assistance Program Note    02/06/2023  Patient ID: Hadden Steig., male   DOB: 09-07-1950, 73 y.o.   MRN: [id number]     02/01/2023 02/06/2023  Outreach Medication One  Initial Outreach Date (Medication One) 10/10/2022   Manufacturer Medication One [employer]   [employer] Farxiga   Dose of Farxiga 10mg    Type of Radiographer, Therapeutic Assistance   Date Application Sent to Patient 10/12/2022   Application Items Requested Application;Proof of Income;Other   Date Application Sent to Prescriber 10/12/2022   Name of Prescriber Norleen Hurst   Patient Assistance Determination  Approved  Approval Start Date  01/03/2023  Approval End Date  01/02/2024  Patient Notification Method  Telephone Call  Telephone Call Outcome  Successful   Care coordination call placed to [employer] in regard toFarxiga application.  Spoke to Marquel who informs application has been APPROVED 01/03/23-01/02/24. He informs a prescription was received and sent to pharmacy for processing on 02/02/23. He informs to allow up to 14 business days for delivery to the patient's home. He informs refills will auto fill and ship to patient's home approximately every 90 days.  Successful outreach to patient. HIPAA verified. Informed patient of his approval, expectant delivery date and refill procedure. Patient verbalized understanding.  Lynisha Osuch, CPhT [place]  Office: [phone] Fax: [phone] Email: [email or web address]

## 2023-02-19 DIAGNOSIS — E1169 Type 2 diabetes mellitus with other specified complication: Secondary | ICD-10-CM | POA: Diagnosis not present

## 2023-02-19 DIAGNOSIS — M109 Gout, unspecified: Secondary | ICD-10-CM | POA: Diagnosis not present

## 2023-02-19 DIAGNOSIS — N390 Urinary tract infection, site not specified: Secondary | ICD-10-CM | POA: Diagnosis not present

## 2023-02-19 DIAGNOSIS — E782 Mixed hyperlipidemia: Secondary | ICD-10-CM | POA: Diagnosis not present

## 2023-02-21 ENCOUNTER — Ambulatory Visit: Payer: PPO | Admitting: Urology

## 2023-02-21 VITALS — BP 181/76 | HR 111

## 2023-02-21 DIAGNOSIS — N401 Enlarged prostate with lower urinary tract symptoms: Secondary | ICD-10-CM | POA: Diagnosis not present

## 2023-02-21 DIAGNOSIS — C61 Malignant neoplasm of prostate: Secondary | ICD-10-CM | POA: Diagnosis not present

## 2023-02-21 DIAGNOSIS — N138 Other obstructive and reflux uropathy: Secondary | ICD-10-CM | POA: Diagnosis not present

## 2023-02-21 DIAGNOSIS — R351 Nocturia: Secondary | ICD-10-CM | POA: Diagnosis not present

## 2023-02-21 LAB — MICROSCOPIC EXAMINATION
Bacteria, UA: NONE SEEN
WBC, UA: NONE SEEN /[HPF] (ref 0–5)

## 2023-02-21 LAB — URINALYSIS, ROUTINE W REFLEX MICROSCOPIC
Bilirubin, UA: NEGATIVE
Ketones, UA: NEGATIVE
Leukocytes,UA: NEGATIVE
Nitrite, UA: NEGATIVE
Specific Gravity, UA: 1.03 (ref 1.005–1.030)
Urobilinogen, Ur: 0.2 mg/dL (ref 0.2–1.0)
pH, UA: 5.5 (ref 5.0–7.5)

## 2023-02-21 LAB — BLADDER SCAN AMB NON-IMAGING: Scan Result: 0

## 2023-02-21 MED ORDER — GEMTESA 75 MG PO TABS: 1.0000 | ORAL_TABLET | Freq: Every day | ORAL | Status: DC

## 2023-02-21 NOTE — Progress Notes (Signed)
 02/21/2023 10:45 AM   Durward Fortes. 15-Jan-1950 [id number]  Referring provider: Benita Stabile, MD [address] [place],  [name] [address]  dysuria   HPI: Mr Willie Oneal is a 73yo here for followup for prostate cancer and BPH with nocturia. He has new dysuria for 2 months. He has urinary urgency and occasional urge incontinence. He had a urine culture sent on Monday and is awaiting results. He is on farxiga 10mg  daily and has been on farxiga for 3 months. Urine stream strong IPSS 14 QOL 4 off of BPH meds. No recent PSA   PMH: Past Medical History:  Diagnosis Date   Diabetes mellitus type 2    GERD (gastroesophageal reflux disease)    History of COVID-19    jan/feb 2020   History of COVID-19 01/2020   mild symptoms all symptoms all symptoms resolved   History of gout    none in years   History of kidney stones    passed on won at least 15 yrs ago per pt on 03-15-2021   HTN (hypertension)    Hyperlipidemia    OsteoArthritis    Prostate cancer [facility])     Surgical History: Past Surgical History:  Procedure Laterality Date   BIOPSY  11/27/2022   Procedure: BIOPSY;  Surgeon: Lanelle Bal, DO;  [facility];  Service: Endoscopy;;   COLONOSCOPY N/A 07/21/2015   Procedure: COLONOSCOPY;  Surgeon: Malissa Hippo, MD;  [facility];  Service: Endoscopy;  Laterality: N/A;  730   COLONOSCOPY WITH PROPOFOL N/A 11/27/2022   Procedure: COLONOSCOPY WITH PROPOFOL;  Surgeon: Lanelle Bal, DO;  [facility];  Service: Endoscopy;  Laterality: N/A;  1:30 PM, ASA 3   CYSTOSCOPY N/A 03/17/2021   Procedure: CYSTOSCOPY;  Surgeon: Malen Gauze, MD;  Location: [facility];  Service: Urology;  Laterality: N/A;   ESOPHAGOGASTRODUODENOSCOPY (EGD) WITH PROPOFOL N/A 11/27/2022   Procedure: ESOPHAGOGASTRODUODENOSCOPY (EGD) WITH PROPOFOL;  Surgeon: Lanelle Bal, DO;  [facility];  Service: Endoscopy;  Laterality: N/A;    left shoudler surgery     in high school result of football accident   POLYPECTOMY  11/27/2022   Procedure: POLYPECTOMY;  Surgeon: Lanelle Bal, DO;  [facility];  Service: Endoscopy;;   RADIOACTIVE SEED IMPLANT N/A 03/17/2021   Procedure: RADIOACTIVE SEED IMPLANT/BRACHYTHERAPY IMPLANT;  Surgeon: Malen Gauze, MD;  Location: [facility];  Service: Urology;  Laterality: N/A;   right shoulder arthroplasty  1999   right shoulder exploration and implantation of antibiotic spacer 05-02-2010     TONSILLECTOMY  1963   and adenoids   vasectomy  1995    Home Medications:  Allergies as of 02/21/2023       Reactions   Penicillins    Unspecified reaction as child        Medication List        Accurate as of February 21, 2023 10:45 AM. If you have any questions, ask your nurse or doctor.          allopurinol 100 MG tablet Commonly known as: ZYLOPRIM Take 100 mg by mouth daily.   allopurinol 100 MG tablet Commonly known as: ZYLOPRIM Take 1 tablet (100 mg total) by mouth daily.   allopurinol 100 MG tablet Commonly known as: ZYLOPRIM Take 1 tablet (100 mg total) by mouth daily.   Farxiga 10 MG Tabs tablet Generic drug: dapagliflozin propanediol Take by  mouth daily.   glipiZIDE 10 MG 24 hr tablet Commonly known as: GLUCOTROL XL Take 10 mg by mouth daily with breakfast.   glipiZIDE 10 MG 24 hr tablet Commonly known as: glipiZIDE XL Take 1 tablet (10 mg total) by mouth 2 (two) times daily.   glipiZIDE 10 MG 24 hr tablet Commonly known as: GLUCOTROL XL Take 1 tablet (10 mg total) by mouth 2 (two) times daily.   lisinopril 20 MG tablet Commonly known as: ZESTRIL Take 20 mg by mouth daily.   lisinopril 20 MG tablet Commonly known as: ZESTRIL Take 1 tablet (20 mg total) by mouth daily.   lisinopril 20 MG tablet Commonly known as: ZESTRIL Take 1 tablet (20 mg total) by mouth daily.   metFORMIN 500 MG 24 hr tablet Commonly known as:  GLUCOPHAGE-XR Take 1,000 mg by mouth 2 (two) times daily.   metFORMIN 500 MG 24 hr tablet Commonly known as: GLUCOPHAGE-XR Take 2 tablets (1,000 mg total) by mouth 2 (two) times daily.   metoprolol tartrate 50 MG tablet Commonly known as: LOPRESSOR Take 1 tablet (50 mg total) by mouth 2 (two) times daily.   OneTouch Delica Plus Lancet30G Misc   OneTouch Delica Plus Lancet30G Misc use as directed   OneTouch Verio test strip Generic drug: glucose blood 1 each 2 (two) times daily.   OneTouch Verio test strip Generic drug: glucose blood use to check blood sugar 1-2 times daily   pantoprazole 20 MG tablet Commonly known as: PROTONIX Take 20 mg by mouth 2 (two) times daily.   pantoprazole 20 MG tablet Commonly known as: PROTONIX Take 1 tablet (20 mg total) by mouth 2 (two) times daily.   pantoprazole 20 MG tablet Commonly known as: PROTONIX Take 1 tablet (20 mg total) by mouth 2 (two) times daily.   simvastatin 20 MG tablet Commonly known as: ZOCOR Take 20 mg by mouth daily.   simvastatin 40 MG tablet Commonly known as: ZOCOR Take 1 tablet (40 mg total) by mouth daily.        Allergies:  Allergies  Allergen Reactions   Penicillins     Unspecified reaction as child    Family History: No family history on file.  Social History:  reports that he quit smoking about 23 years ago. His smoking use included cigarettes. He started smoking about 53 years ago. He has a 30 pack-year smoking history. He has never used smokeless tobacco. He reports that he does not drink alcohol and does not use drugs.  ROS: All other review of systems were reviewed and are negative except what is noted above in HPI  Physical Exam: BP (!) 181/76   Pulse (!) 111   Constitutional:  Alert and oriented, No acute distress. HEENT: [place] AT, moist mucus membranes.  Trachea midline, no masses. Cardiovascular: No clubbing, cyanosis, or edema. Respiratory: Normal respiratory effort, no increased  work of breathing. GI: Abdomen is soft, nontender, nondistended, no abdominal masses GU: No CVA tenderness.  Lymph: No cervical or inguinal lymphadenopathy. Skin: No rashes, bruises or suspicious lesions. Neurologic: Grossly intact, no focal deficits, moving all 4 extremities. Psychiatric: Normal mood and affect.  Laboratory Data: Lab Results  Component Value Date   WBC 6.8 09/28/2022   HGB 15.0 09/28/2022   HCT 48.0 09/28/2022   MCV 81.6 09/28/2022   PLT 162 09/28/2022    Lab Results  Component Value Date   CREATININE 1.13 09/28/2022    No results found for: "PSA"  No results found for: "  TESTOSTERONE"  No results found for: "HGBA1C"  Urinalysis    Component Value Date/Time   COLORURINE YELLOW 03/26/2021 1350   APPEARANCEUR Clear 05/05/2022 0914   LABSPEC 1.025 03/26/2021 1350   PHURINE 5.0 03/26/2021 1350   GLUCOSEU 3+ (A) 05/05/2022 0914   HGBUR MODERATE (A) 03/26/2021 1350   BILIRUBINUR Negative 05/05/2022 0914   KETONESUR NEGATIVE 03/26/2021 1350   PROTEINUR 1+ (A) 05/05/2022 0914   PROTEINUR 30 (A) 03/26/2021 1350   UROBILINOGEN 0.2 05/02/2011 0634   NITRITE Negative 05/05/2022 0914   NITRITE NEGATIVE 03/26/2021 1350   LEUKOCYTESUR Negative 05/05/2022 0914   LEUKOCYTESUR NEGATIVE 03/26/2021 1350    Lab Results  Component Value Date   LABMICR See below: 05/05/2022   WBCUA 0-5 05/05/2022   LABEPIT 0-10 05/05/2022   MUCUS Present (A) 05/05/2022   BACTERIA None seen 05/05/2022    Pertinent Imaging:  No results found for this or any previous visit.  No results found for this or any previous visit.  No results found for this or any previous visit.  No results found for this or any previous visit.  No results found for this or any previous visit.  No results found for this or any previous visit.  No results found for this or any previous visit.  No results found for this or any previous visit.   Assessment & Plan:    1. Prostate cancer  (HCC) (Primary) Followup 6 months with a PSA - BLADDER SCAN AMB NON-IMAGING  2. Benign prostatic hyperplasia with urinary obstruction We will trial gemtesa 75mg  daily - Urinalysis, Routine w reflex microscopic  3. Nocturia We will trial gemtesa 75mg  daily   No follow-ups on file.  Wilkie Aye, MD  [facility] [place]

## 2023-02-21 NOTE — Progress Notes (Signed)
 Bladder Scan completed today.  Patient can void prior to the bladder scan. Bladder scan result: 0  Performed By: [facility] LPN

## 2023-02-22 LAB — PSA: Prostate Specific Ag, Serum: 0.2 ng/mL (ref 0.0–4.0)

## 2023-02-27 ENCOUNTER — Encounter: Payer: Self-pay | Admitting: Urology

## 2023-02-27 ENCOUNTER — Other Ambulatory Visit (HOSPITAL_COMMUNITY): Payer: Self-pay

## 2023-02-27 MED ORDER — ALLOPURINOL 100 MG PO TABS
100.0000 mg | ORAL_TABLET | Freq: Every day | ORAL | 3 refills | Status: DC
  Filled 2023-02-27: qty 100, 100d supply, fill #0

## 2023-02-27 MED ORDER — LISINOPRIL 20 MG PO TABS
20.0000 mg | ORAL_TABLET | Freq: Every day | ORAL | 3 refills | Status: DC
  Filled 2023-02-27: qty 100, 100d supply, fill #0

## 2023-02-27 NOTE — Patient Instructions (Signed)

## 2023-02-28 ENCOUNTER — Other Ambulatory Visit (HOSPITAL_COMMUNITY): Payer: Self-pay

## 2023-03-02 ENCOUNTER — Other Ambulatory Visit (HOSPITAL_COMMUNITY): Payer: Self-pay

## 2023-03-07 ENCOUNTER — Other Ambulatory Visit (HOSPITAL_COMMUNITY): Payer: Self-pay

## 2023-03-08 ENCOUNTER — Other Ambulatory Visit (HOSPITAL_COMMUNITY): Payer: Self-pay

## 2023-03-08 MED ORDER — ONETOUCH DELICA LANCETS 33G MISC
2 refills | Status: AC
  Filled 2023-03-08: qty 100, 30d supply, fill #0

## 2023-03-13 ENCOUNTER — Telehealth: Payer: Self-pay

## 2023-03-13 ENCOUNTER — Other Ambulatory Visit (HOSPITAL_COMMUNITY): Payer: Self-pay

## 2023-03-13 ENCOUNTER — Other Ambulatory Visit: Payer: Self-pay

## 2023-03-13 MED ORDER — MEXILETINE HCL 200 MG PO CAPS
200.0000 mg | ORAL_CAPSULE | Freq: Three times a day (TID) | ORAL | 3 refills | Status: DC
Start: 2023-03-13 — End: 2023-11-23
  Filled 2023-03-13: qty 90, 30d supply, fill #0
  Filled 2023-04-02: qty 270, 90d supply, fill #1
  Filled 2023-07-23: qty 270, 90d supply, fill #2
  Filled 2023-10-30: qty 270, 90d supply, fill #3

## 2023-03-13 NOTE — Telephone Encounter (Signed)
-----   Message from Vishnu P Mallipeddi sent at 03/12/2023 10:07 AM EDT ----- 75 runs of NSVT, fastest/longest lasting 8 beats. 24.8% PVC burden. Asymptomatic. Unchanged from prior event monitor. 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

## 2023-03-13 NOTE — Telephone Encounter (Signed)
 The patient has been notified of the result and verbalized understanding.  All questions (if any) were answered. Roseanne Reno, [facility] 03/13/2023 9:21 AM

## 2023-03-19 ENCOUNTER — Other Ambulatory Visit (HOSPITAL_COMMUNITY): Payer: Self-pay

## 2023-03-19 ENCOUNTER — Other Ambulatory Visit: Payer: Self-pay

## 2023-03-19 DIAGNOSIS — Z87891 Personal history of nicotine dependence: Secondary | ICD-10-CM | POA: Diagnosis not present

## 2023-03-19 DIAGNOSIS — Z6830 Body mass index (BMI) 30.0-30.9, adult: Secondary | ICD-10-CM | POA: Diagnosis not present

## 2023-03-19 DIAGNOSIS — E669 Obesity, unspecified: Secondary | ICD-10-CM | POA: Diagnosis not present

## 2023-03-19 DIAGNOSIS — R002 Palpitations: Secondary | ICD-10-CM | POA: Diagnosis not present

## 2023-03-19 DIAGNOSIS — Z713 Dietary counseling and surveillance: Secondary | ICD-10-CM | POA: Diagnosis not present

## 2023-03-19 DIAGNOSIS — R339 Retention of urine, unspecified: Secondary | ICD-10-CM | POA: Diagnosis not present

## 2023-03-19 DIAGNOSIS — R944 Abnormal results of kidney function studies: Secondary | ICD-10-CM | POA: Diagnosis not present

## 2023-03-19 DIAGNOSIS — C61 Malignant neoplasm of prostate: Secondary | ICD-10-CM | POA: Diagnosis not present

## 2023-03-19 DIAGNOSIS — D751 Secondary polycythemia: Secondary | ICD-10-CM | POA: Diagnosis not present

## 2023-03-19 DIAGNOSIS — I10 Essential (primary) hypertension: Secondary | ICD-10-CM | POA: Diagnosis not present

## 2023-03-19 DIAGNOSIS — Z7182 Exercise counseling: Secondary | ICD-10-CM | POA: Diagnosis not present

## 2023-03-19 MED ORDER — TELMISARTAN 80 MG PO TABS
80.0000 mg | ORAL_TABLET | Freq: Every day | ORAL | 2 refills | Status: DC
  Filled 2023-03-19: qty 30, 30d supply, fill #0
  Filled 2023-04-02 – 2023-04-11 (×2): qty 30, 30d supply, fill #1
  Filled 2023-05-15: qty 30, 30d supply, fill #2

## 2023-03-20 ENCOUNTER — Other Ambulatory Visit (HOSPITAL_COMMUNITY): Payer: Self-pay

## 2023-03-26 ENCOUNTER — Ambulatory Visit: Admitting: Internal Medicine

## 2023-03-26 ENCOUNTER — Encounter: Payer: Self-pay | Admitting: Internal Medicine

## 2023-03-26 ENCOUNTER — Institutional Professional Consult (permissible substitution): Admitting: Primary Care

## 2023-03-26 VITALS — BP 146/76 | HR 79 | Temp 97.7°F | Ht 70.5 in | Wt 208.4 lb

## 2023-03-26 DIAGNOSIS — G4730 Sleep apnea, unspecified: Secondary | ICD-10-CM | POA: Diagnosis not present

## 2023-03-26 DIAGNOSIS — R0683 Snoring: Secondary | ICD-10-CM

## 2023-03-26 DIAGNOSIS — E663 Overweight: Secondary | ICD-10-CM | POA: Diagnosis not present

## 2023-03-26 DIAGNOSIS — Z8546 Personal history of malignant neoplasm of prostate: Secondary | ICD-10-CM | POA: Diagnosis not present

## 2023-03-26 DIAGNOSIS — E785 Hyperlipidemia, unspecified: Secondary | ICD-10-CM | POA: Diagnosis not present

## 2023-03-26 DIAGNOSIS — N3281 Overactive bladder: Secondary | ICD-10-CM | POA: Diagnosis not present

## 2023-03-26 DIAGNOSIS — I10 Essential (primary) hypertension: Secondary | ICD-10-CM | POA: Diagnosis not present

## 2023-03-26 DIAGNOSIS — M109 Gout, unspecified: Secondary | ICD-10-CM | POA: Diagnosis not present

## 2023-03-26 DIAGNOSIS — E1169 Type 2 diabetes mellitus with other specified complication: Secondary | ICD-10-CM | POA: Diagnosis not present

## 2023-03-26 DIAGNOSIS — K219 Gastro-esophageal reflux disease without esophagitis: Secondary | ICD-10-CM | POA: Diagnosis not present

## 2023-03-26 DIAGNOSIS — J309 Allergic rhinitis, unspecified: Secondary | ICD-10-CM | POA: Diagnosis not present

## 2023-03-26 DIAGNOSIS — I499 Cardiac arrhythmia, unspecified: Secondary | ICD-10-CM | POA: Diagnosis not present

## 2023-03-26 NOTE — Patient Instructions (Signed)
 Order- schedule home sleep test   dx snoring  Please call me about 2 weeks after your sleep test for results and recommendations

## 2023-03-26 NOTE — Progress Notes (Signed)
 03/26/23- 31 yoM former smoker for sleep evaluation self-referred Medical problem list includes HTN, PVCs, GERD, DM2, Prostate Ca,  Epworth score-14 Body weight today- 208 lbs -----Sleep consult to see if sleep is affecting high blood pressure and heart arrhythmias.  Aware of loud snoring. ENT surgery for tonsils. No lung problems.Some daytime tiredness. Light caffeine user. Discussed the use of AI scribe software for clinical note transcription with the patient, who gave verbal consent to proceed. History of Present Illness   The patient, with a history of diabetes, irregular heartbeat, and high blood pressure, presents with concerns about his sleep. He reports loud snoring and waking up multiple times during the night to use the bathroom. He sleeps with multiple pillows and tends to roll from one side to the other during the night. The patient's father had similar sleep issues, including loud snoring and kicking in bed, which the patient believes may have been a manifestation of acting out dreams. The patient denies experiencing similar kicking episodes. He also mentions feeling tired during the day, particularly on Sundays, and sometimes falling asleep if not engaged in conversation. The patient has made changes to his caffeine intake on the advice of his heart doctor, which he speculates may be contributing to his sleep issues. ENT surgery for tonsils. Occasional light caffeine use. No sleep meds.   Prior to Admission medications   Medication Sig Start Date End Date Taking? Authorizing Provider  allopurinol (ZYLOPRIM) 100 MG tablet Take 100 mg by mouth daily.   Yes [provider]  allopurinol (ZYLOPRIM) 100 MG tablet Take 1 tablet (100 mg total) by mouth daily. 10/20/22  Yes   allopurinol (ZYLOPRIM) 100 MG tablet Take 1 tablet (100 mg total) by mouth daily. 01/23/23  Yes   allopurinol (ZYLOPRIM) 100 MG tablet Take 1 tablet (100 mg total) by mouth daily. 02/27/23  Yes   dapagliflozin  propanediol (FARXIGA) 10 MG TABS tablet Take by mouth daily.   Yes [provider]  glipiZIDE (GLIPIZIDE XL) 10 MG 24 hr tablet Take 1 tablet (10 mg total) by mouth 2 (two) times daily. 05/04/22  Yes   glipiZIDE (GLUCOTROL XL) 10 MG 24 hr tablet Take 10 mg by mouth daily with breakfast.   Yes [provider]  glipiZIDE (GLUCOTROL XL) 10 MG 24 hr tablet Take 1 tablet (10 mg total) by mouth 2 (two) times daily. 01/23/23  Yes   glucose blood test strip use to check blood sugar 1-2 times daily 10/12/22  Yes   Lancets (ONETOUCH DELICA PLUS LANCET30G) MISC  04/20/22  Yes [provider]  Lancets (ONETOUCH DELICA PLUS LANCET30G) MISC use as directed 07/16/22  Yes   lisinopril (ZESTRIL) 20 MG tablet Take 20 mg by mouth daily.   Yes [provider]  lisinopril (ZESTRIL) 20 MG tablet Take 1 tablet (20 mg total) by mouth daily. 08/30/22  Yes   lisinopril (ZESTRIL) 20 MG tablet Take 1 tablet (20 mg total) by mouth daily. 01/23/23  Yes   lisinopril (ZESTRIL) 20 MG tablet Take 1 tablet (20 mg total) by mouth daily. 02/27/23  Yes   metFORMIN (GLUCOPHAGE-XR) 500 MG 24 hr tablet Take 1,000 mg by mouth 2 (two) times daily. 01/21/21  Yes [provider]  metFORMIN (GLUCOPHAGE-XR) 500 MG 24 hr tablet Take 2 tablets (1,000 mg total) by mouth 2 (two) times daily. 01/23/23  Yes   metoprolol tartrate (LOPRESSOR) 50 MG tablet Take 1 tablet (50 mg total) by mouth 2 (two) times daily. 10/23/22  Yes Mallipeddi, Vishnu P, MD  mexiletine (MEXITIL) 200 MG capsule Take 1 capsule (200 mg total) by mouth 3 (three) times daily. 03/13/23  Yes Mallipeddi, Vishnu P, MD  OneTouch Delica Lancets 33G MISC Use as directed. 03/02/23  Yes   ONETOUCH VERIO test strip 1 each 2 (two) times daily. 08/03/22  Yes [provider]  pantoprazole (PROTONIX) 20 MG tablet Take 20 mg by mouth 2 (two) times daily. 07/14/22  Yes [provider]  pantoprazole (PROTONIX) 20 MG tablet Take 1 tablet (20 mg total)  by mouth 2 (two) times daily. 07/16/22  Yes   pantoprazole (PROTONIX) 20 MG tablet Take 1 tablet (20 mg total) by mouth 2 (two) times daily. 01/23/23  Yes   simvastatin (ZOCOR) 20 MG tablet Take 20 mg by mouth daily.   Yes [provider]  simvastatin (ZOCOR) 40 MG tablet Take 1 tablet (40 mg total) by mouth daily. 01/23/23  Yes   telmisartan (MICARDIS) 80 MG tablet Take 1 tablet (80 mg total) by mouth daily. 03/19/23  Yes   Vibegron (GEMTESA) 75 MG TABS Take 1 tablet (75 mg total) by mouth daily. 02/21/23  Yes McKenzie, Mardene Celeste, MD   Past Medical History:  Diagnosis Date   Diabetes mellitus type 2    GERD (gastroesophageal reflux disease)    History of COVID-19    jan/feb 2020   History of COVID-19 01/2020   mild symptoms all symptoms all symptoms resolved   History of gout    none in years   History of kidney stones    passed on won at least 15 yrs ago per pt on 03-15-2021   HTN (hypertension)    Hyperlipidemia    OsteoArthritis    Prostate cancer [facility])    Past Surgical History:  Procedure Laterality Date   BIOPSY  11/27/2022   Procedure: BIOPSY;  Surgeon: Lanelle Bal, DO;  [facility]: [facility];  Service: Endoscopy;;   COLONOSCOPY N/A 07/21/2015   Procedure: COLONOSCOPY;  Surgeon: Malissa Hippo, MD;  Location: [facility];  Service: Endoscopy;  Laterality: N/A;  730   COLONOSCOPY WITH PROPOFOL N/A 11/27/2022   Procedure: COLONOSCOPY WITH PROPOFOL;  Surgeon: Lanelle Bal, DO;  Location: [facility];  Service: Endoscopy;  Laterality: N/A;  1:30 PM, ASA 3   CYSTOSCOPY N/A 03/17/2021   Procedure: CYSTOSCOPY;  Surgeon: Malen Gauze, MD;  Location: [facility];  Service: Urology;  Laterality: N/A;   ESOPHAGOGASTRODUODENOSCOPY (EGD) WITH PROPOFOL N/A 11/27/2022   Procedure: ESOPHAGOGASTRODUODENOSCOPY (EGD) WITH PROPOFOL;  Surgeon: Lanelle Bal, DO;  Location: [facility];  Service: Endoscopy;  Laterality: N/A;   left shoudler surgery      in high school result of football accident   POLYPECTOMY  11/27/2022   Procedure: POLYPECTOMY;  Surgeon: Lanelle Bal, DO;  [facility]: [facility];  Service: Endoscopy;;   RADIOACTIVE SEED IMPLANT N/A 03/17/2021   Procedure: RADIOACTIVE SEED IMPLANT/BRACHYTHERAPY IMPLANT;  Surgeon: Malen Gauze, MD;  Location: [facility];  Service: Urology;  Laterality: N/A;   right shoulder arthroplasty  1999   right shoulder exploration and implantation of antibiotic spacer 05-02-2010     TONSILLECTOMY  1963   and adenoids   vasectomy  1995   History reviewed. No pertinent family history. Social History   Socioeconomic History   Marital status: Married    Spouse name: Not on file   Number of children: Not on file   Years  of education: Not on file   Highest education level: Not on file  Occupational History   Not on file  Tobacco Use   Smoking status: Former    Current packs/day: 0.00    Average packs/day: 1 pack/day for 30.0 years (30.0 ttl pk-yrs)    Types: Cigarettes    Start date: 01/02/1970    Quit date: 01/03/2000    Years since quitting: 23.2   Smokeless tobacco: Never  Vaping Use   Vaping status: Never Used  Substance and Sexual Activity   Alcohol use: No   Drug use: No   Sexual activity: Not on file  Other Topics Concern   Not on file  Social History Narrative   Not on file   Social Drivers of Health   Financial Resource Strain: Not on file  Food Insecurity: Not on file  Transportation Needs: Not on file  Physical Activity: Not on file  Stress: Not on file  Social Connections: Not on file  Intimate Partner Violence: Not on file   ROS-see HPI   + = positive Constitutional:    weight loss, night sweats, fevers, chills, fatigue, lassitude. HEENT:    headaches, difficulty swallowing, tooth/dental problems, sore throat,       sneezing, itching, ear ache, nasal congestion, post nasal drip, snoring CV:    chest pain, orthopnea, PND, swelling in  lower extremities, anasarca,                                   dizziness, palpitations Resp:   shortness of breath with exertion or at rest.                productive cough,   non-productive cough, coughing up of blood.              change in color of mucus.  wheezing.   Skin:    rash or lesions. GI:  No-   heartburn, indigestion, abdominal pain, nausea, vomiting, diarrhea,                 change in bowel habits, loss of appetite GU: dysuria, change in color of urine, no urgency or frequency.   flank pain. MS:   joint pain, stiffness, decreased range of motion, back pain. Neuro-     nothing unusual Psych:  change in mood or affect.  depression or anxiety.   memory loss.  OBJ- Physical Exam General- Alert, Oriented, Affect-appropriate, Distress- none acute, +not obese Skin- rash-none, lesions- none, excoriation- none Lymphadenopathy- none Head- atraumatic            Eyes- Gross vision intact, PERRLA, conjunctivae and secretions clear            Ears- Hearing, canals-normal            Nose- Clear, no-Septal dev, mucus, polyps, erosion, perforation             Throat- Mallampati III , mucosa clear , drainage- none, tonsils- atrophic, +teeth Neck- flexible , trachea midline, no stridor , thyroid nl, carotid no bruit Chest - symmetrical excursion , unlabored           Heart/CV- RRR , no murmur , no gallop  , no rub, nl s1 s2                           - JVD- none , edema- none, stasis  changes- none, varices- none           Lung- clear to P&A, wheeze- none, cough- none , dullness-none, rub- none           Chest wall-  Abd-  Br/ Gen/ Rectal- Not done, not indicated Extrem- cyanosis- none, clubbing, none, atrophy- none, strength- nl Neuro- grossly intact to observation  Assessment and Plan:    Suspected Obstructive Sleep Apnea (OSA) Symptoms and family history suggest OSA. Comorbid conditions include hypertension, arrhythmia, and diabetes, potentially exacerbated by untreated OSA.  Explained long-term cardiovascular and metabolic risks. Discussed diagnosis and management importance, emphasizing CPAP as most effective. - Order home sleep test to evaluate for OSA. - Discuss CPAP, oral appliances, and surgical options if OSA is confirmed.  Hypertension Hypertension may be exacerbated by untreated OSA, increasing cardiovascular risk.  Irregular Heartbeat Irregular heartbeat may be related to or exacerbated by OSA, increasing arrhythmia risk.  Diabetes Mellitus OSA may adversely affect glucose control and increase cardiovascular risk in diabetes.  Follow-up Prefers management through pulmonology clinic for comprehensive OSA care. - Follow up with pulmonology clinic after home sleep test results. - Instruct him to contact clinic for any issues or questions regarding sleep test or results.

## 2023-04-02 ENCOUNTER — Other Ambulatory Visit (HOSPITAL_COMMUNITY): Payer: Self-pay

## 2023-04-02 ENCOUNTER — Other Ambulatory Visit: Payer: Self-pay

## 2023-04-03 ENCOUNTER — Other Ambulatory Visit: Payer: Self-pay

## 2023-04-09 ENCOUNTER — Other Ambulatory Visit (HOSPITAL_COMMUNITY): Payer: Self-pay

## 2023-04-09 ENCOUNTER — Other Ambulatory Visit: Payer: Self-pay

## 2023-04-09 DIAGNOSIS — I10 Essential (primary) hypertension: Secondary | ICD-10-CM | POA: Diagnosis not present

## 2023-04-09 DIAGNOSIS — Z6829 Body mass index (BMI) 29.0-29.9, adult: Secondary | ICD-10-CM | POA: Diagnosis not present

## 2023-04-09 DIAGNOSIS — Z7182 Exercise counseling: Secondary | ICD-10-CM | POA: Diagnosis not present

## 2023-04-09 DIAGNOSIS — C61 Malignant neoplasm of prostate: Secondary | ICD-10-CM | POA: Diagnosis not present

## 2023-04-09 DIAGNOSIS — R002 Palpitations: Secondary | ICD-10-CM | POA: Diagnosis not present

## 2023-04-09 DIAGNOSIS — Z87891 Personal history of nicotine dependence: Secondary | ICD-10-CM | POA: Diagnosis not present

## 2023-04-09 DIAGNOSIS — R339 Retention of urine, unspecified: Secondary | ICD-10-CM | POA: Diagnosis not present

## 2023-04-09 DIAGNOSIS — E669 Obesity, unspecified: Secondary | ICD-10-CM | POA: Diagnosis not present

## 2023-04-09 DIAGNOSIS — D751 Secondary polycythemia: Secondary | ICD-10-CM | POA: Diagnosis not present

## 2023-04-09 DIAGNOSIS — Z713 Dietary counseling and surveillance: Secondary | ICD-10-CM | POA: Diagnosis not present

## 2023-04-09 DIAGNOSIS — R944 Abnormal results of kidney function studies: Secondary | ICD-10-CM | POA: Diagnosis not present

## 2023-04-09 DIAGNOSIS — R0683 Snoring: Secondary | ICD-10-CM | POA: Diagnosis not present

## 2023-04-09 MED ORDER — AMLODIPINE BESYLATE 5 MG PO TABS
5.0000 mg | ORAL_TABLET | Freq: Every day | ORAL | 2 refills | Status: DC
  Filled 2023-04-09: qty 30, 30d supply, fill #0

## 2023-04-09 MED ORDER — TELMISARTAN 80 MG PO TABS
80.0000 mg | ORAL_TABLET | Freq: Every day | ORAL | 2 refills | Status: DC
  Filled 2023-04-09 – 2023-06-18 (×2): qty 30, 30d supply, fill #0

## 2023-04-09 MED FILL — Metoprolol Tartrate Tab 50 MG: ORAL | 90 days supply | Qty: 180 | Fill #0 | Status: AC

## 2023-04-10 ENCOUNTER — Other Ambulatory Visit (HOSPITAL_COMMUNITY): Payer: Self-pay

## 2023-04-23 ENCOUNTER — Encounter (HOSPITAL_COMMUNITY): Payer: Self-pay | Admitting: Emergency Medicine

## 2023-04-23 ENCOUNTER — Other Ambulatory Visit: Payer: Self-pay

## 2023-04-23 ENCOUNTER — Emergency Department (HOSPITAL_COMMUNITY)

## 2023-04-23 ENCOUNTER — Emergency Department (HOSPITAL_COMMUNITY)
Admission: EM | Admit: 2023-04-23 | Discharge: 2023-04-23 | Disposition: A | Discharge status: Home / Self Care | Attending: Emergency Medicine | Admitting: Emergency Medicine

## 2023-04-23 DIAGNOSIS — R1032 Left lower quadrant pain: Secondary | ICD-10-CM | POA: Diagnosis not present

## 2023-04-23 DIAGNOSIS — R1084 Generalized abdominal pain: Secondary | ICD-10-CM

## 2023-04-23 DIAGNOSIS — R109 Unspecified abdominal pain: Secondary | ICD-10-CM | POA: Diagnosis present

## 2023-04-23 DIAGNOSIS — K4090 Unilateral inguinal hernia, without obstruction or gangrene, not specified as recurrent: Secondary | ICD-10-CM | POA: Insufficient documentation

## 2023-04-23 DIAGNOSIS — N200 Calculus of kidney: Secondary | ICD-10-CM | POA: Diagnosis not present

## 2023-04-23 DIAGNOSIS — R161 Splenomegaly, not elsewhere classified: Secondary | ICD-10-CM | POA: Diagnosis not present

## 2023-04-23 DIAGNOSIS — K4020 Bilateral inguinal hernia, without obstruction or gangrene, not specified as recurrent: Secondary | ICD-10-CM | POA: Diagnosis not present

## 2023-04-23 DIAGNOSIS — I10 Essential (primary) hypertension: Secondary | ICD-10-CM | POA: Diagnosis not present

## 2023-04-23 LAB — CBC
HCT: 47.1 % (ref 39.0–52.0)
Hemoglobin: 16.7 g/dL (ref 13.0–17.0)
MCH: 30.3 pg (ref 26.0–34.0)
MCHC: 35.5 g/dL (ref 30.0–36.0)
MCV: 85.3 fL (ref 80.0–100.0)
Platelets: 174 10*3/uL (ref 150–400)
RBC: 5.52 MIL/uL (ref 4.22–5.81)
RDW: 14.5 % (ref 11.5–15.5)
WBC: 6.2 10*3/uL (ref 4.0–10.5)
nRBC: 0 % (ref 0.0–0.2)

## 2023-04-23 LAB — COMPREHENSIVE METABOLIC PANEL WITH GFR
ALT: 16 U/L (ref 0–44)
AST: 19 U/L (ref 15–41)
Albumin: 4 g/dL (ref 3.5–5.0)
Alkaline Phosphatase: 67 U/L (ref 38–126)
Anion gap: 9 (ref 5–15)
BUN: 11 mg/dL (ref 8–23)
CO2: 22 mmol/L (ref 22–32)
Calcium: 9 mg/dL (ref 8.9–10.3)
Chloride: 107 mmol/L (ref 98–111)
Creatinine, Ser: 0.88 mg/dL (ref 0.61–1.24)
GFR, Estimated: >60 mL/min (ref >60)
Glucose, Bld: 151 mg/dL — ABNORMAL HIGH (ref 70–99)
Potassium: 3.7 mmol/L (ref 3.5–5.1)
Sodium: 138 mmol/L (ref 135–145)
Total Bilirubin: 1.3 mg/dL — ABNORMAL HIGH (ref 0.0–1.2)
Total Protein: 6.7 g/dL (ref 6.5–8.1)

## 2023-04-23 LAB — URINALYSIS, ROUTINE W REFLEX MICROSCOPIC
Bacteria, UA: NONE SEEN
Bilirubin Urine: NEGATIVE
Glucose, UA: >=500 mg/dL — AB
Hgb urine dipstick: NEGATIVE
Ketones, ur: NEGATIVE mg/dL
Leukocytes,Ua: NEGATIVE
Nitrite: NEGATIVE
Protein, ur: 100 mg/dL — AB
Specific Gravity, Urine: 1.026 (ref 1.005–1.030)
pH: 5 (ref 5.0–8.0)

## 2023-04-23 LAB — LIPASE, BLOOD: Lipase: 31 U/L (ref 11–51)

## 2023-04-23 MED ORDER — MORPHINE SULFATE (PF) 4 MG/ML IV SOLN
4.0000 mg | Freq: Once | INTRAVENOUS | Status: AC
  Administered 2023-04-23: 4 mg via INTRAVENOUS
  Filled 2023-04-23: qty 1

## 2023-04-23 MED ORDER — OXYCODONE-ACETAMINOPHEN 5-325 MG PO TABS: 1.0000 | ORAL_TABLET | Freq: Four times a day (QID) | ORAL | 0 refills | Status: DC | PRN

## 2023-04-23 MED ORDER — IOHEXOL 9 MG/ML PO SOLN
ORAL | Status: AC
  Filled 2023-04-23: qty 1000

## 2023-04-23 MED ORDER — SODIUM CHLORIDE 0.9 % IV SOLN: INTRAVENOUS | Status: DC

## 2023-04-23 MED ORDER — OXYCODONE-ACETAMINOPHEN 5-325 MG PO TABS: 1.0000 | ORAL_TABLET | Freq: Four times a day (QID) | ORAL | 0 refills | Status: AC | PRN

## 2023-04-23 MED ORDER — ONDANSETRON HCL 4 MG/2ML IJ SOLN
4.0000 mg | Freq: Once | INTRAMUSCULAR | Status: AC
  Administered 2023-04-23: 4 mg via INTRAVENOUS
  Filled 2023-04-23: qty 2

## 2023-04-23 NOTE — Discharge Instructions (Addendum)
 Your CT scan shows you have a hernia on the left and a small portion of your bladder is included in this.  There is also some sclerosis noted of one of the bones in the pelvis and a radiologist recommend that you follow-up with your urologist to have your PSA rechecked.  Please call and schedule an appointment with your urologist.  Take Tylenol  at home as needed for pain.  Return to the ER for worsening symptoms.

## 2023-04-23 NOTE — ED Provider Notes (Signed)
 I was asked to follow-up on the patient CT scan and reevaluate for ultimate disposition.  The patient CT scan was largely unremarkable.  There was a sclerotic lesion as well as a left inguinal hernia with a small amount of his bladder included but there are no findings of infection on his urinalysis.  The patient is comfortable on reassessment.  He does have a urologist.  I have encouraged him to follow-up with his urologist to have his PSA rechecked and for further evaluation.  Physical Exam  BP 135/61   Pulse 80   Temp 98 F (36.7 C) (Oral)   Resp 20   Ht 5' 10.5" (1.791 m)   Wt 90.7 kg   SpO2 96%   BMI 28.29 kg/m   Physical Exam Gen:  NAD  Procedures  Procedures  ED Course / MDM    Medical Decision Making Amount and/or Complexity of Data Reviewed Labs: ordered. Radiology: ordered.  Risk Prescription drug management.          Carin Charleston, MD 04/23/23 [phone]

## 2023-04-23 NOTE — ED Triage Notes (Signed)
 Pt presents with left flank pain and umbilicus pain x1 week.

## 2023-04-23 NOTE — ED Provider Notes (Signed)
 [place] EMERGENCY DEPARTMENT AT [facility] Provider Note   CSN: [id number] Arrival date & time: 04/23/23  0940     History {Add pertinent medical, surgical, social history, OB history to HPI:1} Chief Complaint  Patient presents with   Abdominal Pain    Willie Oneal. is a 73 y.o. male.  HPI Patient presents with abdominal pain.  Pain is left inguinal, left flank.  He has a history of kidney stones, in the distant past.  He also takes metformin , notes that he has occasional inconsistent bowel movements with this.  This episode of pain began over the past 24 hours though he had a prodromal episode about 1 week ago that was similar, though not as pronounced.  Pain is focal in those areas, negligible on the right side with no vomiting though he has mild nausea.    Home Medications Prior to Admission medications   Medication Sig Start Date End Date Taking? Authorizing Provider  allopurinol  (ZYLOPRIM ) 100 MG tablet Take 100 mg by mouth daily.    [provider]  allopurinol  (ZYLOPRIM ) 100 MG tablet Take 1 tablet (100 mg total) by mouth daily. 10/20/22     allopurinol  (ZYLOPRIM ) 100 MG tablet Take 1 tablet (100 mg total) by mouth daily. 01/23/23     allopurinol  (ZYLOPRIM ) 100 MG tablet Take 1 tablet (100 mg total) by mouth daily. 02/27/23     amLODipine  (NORVASC ) 5 MG tablet Take 1 tablet (5 mg total) by mouth daily. 04/09/23     dapagliflozin propanediol (FARXIGA) 10 MG TABS tablet Take by mouth daily.    [provider]  glipiZIDE  (GLIPIZIDE  XL) 10 MG 24 hr tablet Take 1 tablet (10 mg total) by mouth 2 (two) times daily. 05/04/22     glipiZIDE  (GLUCOTROL  XL) 10 MG 24 hr tablet Take 10 mg by mouth daily with breakfast.    [provider]  glipiZIDE  (GLUCOTROL  XL) 10 MG 24 hr tablet Take 1 tablet (10 mg total) by mouth 2 (two) times daily. 01/23/23     glucose blood test strip use to check blood sugar 1-2 times daily 10/12/22     Lancets (ONETOUCH  DELICA PLUS LANCET30G) MISC  04/20/22   [provider]  Lancets [facility] DELICA PLUS LANCET30G) MISC use as directed 07/16/22     lisinopril  (ZESTRIL ) 20 MG tablet Take 20 mg by mouth daily.    [provider]  lisinopril  (ZESTRIL ) 20 MG tablet Take 1 tablet (20 mg total) by mouth daily. 08/30/22     lisinopril  (ZESTRIL ) 20 MG tablet Take 1 tablet (20 mg total) by mouth daily. 01/23/23     metFORMIN  (GLUCOPHAGE -XR) 500 MG 24 hr tablet Take 1,000 mg by mouth 2 (two) times daily. 01/21/21   [provider]  metFORMIN  (GLUCOPHAGE -XR) 500 MG 24 hr tablet Take 2 tablets (1,000 mg total) by mouth 2 (two) times daily. 01/23/23     metoprolol  tartrate (LOPRESSOR ) 50 MG tablet Take 1 tablet (50 mg total) by mouth 2 (two) times daily. 10/23/22   Mallipeddi, Vishnu P, MD  mexiletine (MEXITIL ) 200 MG capsule Take 1 capsule (200 mg total) by mouth 3 (three) times daily. 03/13/23   Mallipeddi, Kennyth Pean, MD  OneTouch Delica Lancets 33G MISC Use as directed. 03/02/23     ONETOUCH VERIO test strip 1 each 2 (two) times daily. 08/03/22   [provider]  pantoprazole  (PROTONIX ) 20 MG tablet Take 20 mg by mouth 2 (two) times daily. 07/14/22  [provider]  pantoprazole  (PROTONIX ) 20 MG tablet Take 1 tablet (20 mg total) by mouth 2 (two) times daily. 07/16/22     pantoprazole  (PROTONIX ) 20 MG tablet Take 1 tablet (20 mg total) by mouth 2 (two) times daily. 01/23/23     simvastatin  (ZOCOR ) 20 MG tablet Take 20 mg by mouth daily.    [provider]  simvastatin  (ZOCOR ) 40 MG tablet Take 1 tablet (40 mg total) by mouth daily. 01/23/23     telmisartan  (MICARDIS ) 80 MG tablet Take 1 tablet (80 mg total) by mouth daily. 03/19/23     telmisartan  (MICARDIS ) 80 MG tablet Take 1 tablet (80 mg total) by mouth daily. 04/09/23     Vibegron  (GEMTESA ) 75 MG TABS Take 1 tablet (75 mg total) by mouth daily. 02/21/23   McKenzie, Arden Beck, MD      Allergies    Penicillins    Review of  Systems   Review of Systems  Physical Exam Updated Vital Signs BP (!) 141/84   Pulse 67   Temp 97.8 F (36.6 C) (Oral)   Resp 16   Ht 5' 10.5" (1.791 m)   Wt 90.7 kg   SpO2 97%   BMI 28.29 kg/m  Physical Exam Nursing note reviewed.  Abdominal:     Tenderness: There is abdominal tenderness in the left lower quadrant.     ED Results / Procedures / Treatments   Labs (all labs ordered are listed, but only abnormal results are displayed) Labs Reviewed  COMPREHENSIVE METABOLIC PANEL WITH GFR - Abnormal; Notable for the following components:      Result Value   Glucose, Bld 151 (*)    Total Bilirubin 1.3 (*)    All other components within normal limits  URINALYSIS, ROUTINE W REFLEX MICROSCOPIC - Abnormal; Notable for the following components:   APPearance HAZY (*)    Glucose, UA >=500 (*)    Protein, ur 100 (*)    All other components within normal limits  LIPASE, BLOOD  CBC    EKG EKG Interpretation Date/Time:  Monday April 23 2023 10:23:39 EDT Ventricular Rate:  83 PR Interval:  163 QRS Duration:  92 QT Interval:  384 QTC Calculation: 452 R Axis:   16  Text Interpretation: Sinus rhythm Right atrial enlargement Baseline wander in lead(s) V3 Confirmed by Dorenda Gandy [phone]) on 04/23/2023 10:54:02 AM  Radiology No results found.  Procedures Procedures  {Document cardiac monitor, telemetry assessment procedure when appropriate:1}  Medications Ordered in ED Medications  0.9 %  sodium chloride  infusion ( Intravenous New Bag/Given 04/23/23 1030)  ondansetron  (ZOFRAN ) injection 4 mg (4 mg Intravenous Given 04/23/23 1034)  morphine  (PF) 4 MG/ML injection 4 mg (4 mg Intravenous Given 04/23/23 1035)    ED Course/ Medical Decision Making/ A&P   {   Click here for ABCD2, HEART and other calculatorsREFRESH Note before signing :1}                              Medical Decision Making Adult male with inconsistent bowel movements, flank and inguinal pain now with  concern for kidney stone, colitis versus diverticulitis, mass.  Patient denies genital changes, less likely scrotal pathology, though infection is consideration as well.  CT ordered, labs IV narcotics antiemetics. Cardiac 65 sinus normal pulse ox 97% room air normal  Amount and/or Complexity of Data Reviewed External Data Reviewed: notes. Labs: ordered. Decision-making details documented in ED Course.  Radiology: ordered and independent interpretation performed. Decision-making details documented in ED Course. ECG/medicine tests: ordered and independent interpretation performed. Decision-making details documented in ED Course.  Risk Decision regarding hospitalization.   ***  {Document critical care time when appropriate:1} {Document review of labs and clinical decision tools ie heart score, Chads2Vasc2 etc:1}  {Document your independent review of radiology images, and any outside records:1} {Document your discussion with family members, caretakers, and with consultants:1} {Document social determinants of health affecting pt's care:1} {Document your decision making why or why not admission, treatments were needed:1} Final Clinical Impression(s) / ED Diagnoses Final diagnoses:  None    Rx / DC Orders ED Discharge Orders     None

## 2023-04-24 ENCOUNTER — Other Ambulatory Visit: Payer: Self-pay

## 2023-04-24 DIAGNOSIS — N200 Calculus of kidney: Secondary | ICD-10-CM

## 2023-04-24 MED FILL — Oxycodone w/ Acetaminophen Tab 5-325 MG: ORAL | Qty: 6 | Status: AC

## 2023-04-25 ENCOUNTER — Ambulatory Visit (HOSPITAL_COMMUNITY)
Admission: RE | Admit: 2023-04-25 | Discharge: 2023-04-25 | Disposition: A | Discharge status: Home / Self Care | Source: Ambulatory Visit | Attending: Urology | Admitting: Urology

## 2023-04-25 DIAGNOSIS — N200 Calculus of kidney: Secondary | ICD-10-CM | POA: Diagnosis not present

## 2023-04-25 DIAGNOSIS — R109 Unspecified abdominal pain: Secondary | ICD-10-CM | POA: Diagnosis not present

## 2023-05-02 ENCOUNTER — Other Ambulatory Visit: Payer: Self-pay

## 2023-05-02 ENCOUNTER — Other Ambulatory Visit (HOSPITAL_COMMUNITY): Payer: Self-pay

## 2023-05-03 ENCOUNTER — Other Ambulatory Visit: Payer: Self-pay

## 2023-05-03 ENCOUNTER — Other Ambulatory Visit (HOSPITAL_COMMUNITY): Payer: Self-pay

## 2023-05-03 MED ORDER — PANTOPRAZOLE SODIUM 20 MG PO TBEC
20.0000 mg | DELAYED_RELEASE_TABLET | Freq: Two times a day (BID) | ORAL | 0 refills | Status: DC
  Filled 2023-05-03: qty 180, 90d supply, fill #0

## 2023-05-03 MED ORDER — METFORMIN HCL ER 500 MG PO TB24
1000.0000 mg | ORAL_TABLET | Freq: Two times a day (BID) | ORAL | 1 refills | Status: DC
  Filled 2023-05-03: qty 360, 90d supply, fill #0

## 2023-05-03 MED ORDER — GLIPIZIDE ER 10 MG PO TB24
10.0000 mg | ORAL_TABLET | Freq: Two times a day (BID) | ORAL | 0 refills | Status: DC
Start: 2023-05-02 — End: 2023-11-23
  Filled 2023-05-03 (×2): qty 180, 90d supply, fill #0

## 2023-05-07 ENCOUNTER — Other Ambulatory Visit (HOSPITAL_COMMUNITY): Payer: Self-pay

## 2023-05-07 DIAGNOSIS — R944 Abnormal results of kidney function studies: Secondary | ICD-10-CM | POA: Diagnosis not present

## 2023-05-07 DIAGNOSIS — R0683 Snoring: Secondary | ICD-10-CM | POA: Diagnosis not present

## 2023-05-07 DIAGNOSIS — K4090 Unilateral inguinal hernia, without obstruction or gangrene, not specified as recurrent: Secondary | ICD-10-CM | POA: Diagnosis not present

## 2023-05-07 DIAGNOSIS — R339 Retention of urine, unspecified: Secondary | ICD-10-CM | POA: Diagnosis not present

## 2023-05-07 DIAGNOSIS — D751 Secondary polycythemia: Secondary | ICD-10-CM | POA: Diagnosis not present

## 2023-05-07 DIAGNOSIS — Z87891 Personal history of nicotine dependence: Secondary | ICD-10-CM | POA: Diagnosis not present

## 2023-05-07 DIAGNOSIS — E669 Obesity, unspecified: Secondary | ICD-10-CM | POA: Diagnosis not present

## 2023-05-07 DIAGNOSIS — C61 Malignant neoplasm of prostate: Secondary | ICD-10-CM | POA: Diagnosis not present

## 2023-05-07 DIAGNOSIS — Z6828 Body mass index (BMI) 28.0-28.9, adult: Secondary | ICD-10-CM | POA: Diagnosis not present

## 2023-05-07 DIAGNOSIS — I10 Essential (primary) hypertension: Secondary | ICD-10-CM | POA: Diagnosis not present

## 2023-05-07 DIAGNOSIS — R002 Palpitations: Secondary | ICD-10-CM | POA: Diagnosis not present

## 2023-05-07 MED ORDER — AMLODIPINE BESYLATE 10 MG PO TABS
10.0000 mg | ORAL_TABLET | Freq: Every day | ORAL | 1 refills | Status: DC
  Filled 2023-05-07: qty 100, 100d supply, fill #0

## 2023-05-15 ENCOUNTER — Other Ambulatory Visit (HOSPITAL_COMMUNITY): Payer: Self-pay

## 2023-05-17 ENCOUNTER — Encounter: Payer: Self-pay | Admitting: Internal Medicine

## 2023-05-17 ENCOUNTER — Ambulatory Visit: Admitting: Internal Medicine

## 2023-05-17 VITALS — BP 150/72 | HR 86 | Ht 70.0 in | Wt 206.0 lb

## 2023-05-17 DIAGNOSIS — I499 Cardiac arrhythmia, unspecified: Secondary | ICD-10-CM | POA: Diagnosis not present

## 2023-05-17 DIAGNOSIS — R0683 Snoring: Secondary | ICD-10-CM

## 2023-05-17 DIAGNOSIS — E119 Type 2 diabetes mellitus without complications: Secondary | ICD-10-CM | POA: Diagnosis not present

## 2023-05-17 DIAGNOSIS — G4733 Obstructive sleep apnea (adult) (pediatric): Secondary | ICD-10-CM

## 2023-05-17 DIAGNOSIS — I10 Essential (primary) hypertension: Secondary | ICD-10-CM | POA: Diagnosis not present

## 2023-05-17 DIAGNOSIS — Z87891 Personal history of nicotine dependence: Secondary | ICD-10-CM | POA: Diagnosis not present

## 2023-05-17 NOTE — Progress Notes (Signed)
 03/26/23- 26 yoM former smoker for sleep evaluation self-referred Medical problem list includes HTN, PVCs, GERD, DM2, Prostate Ca,  Epworth score-14 Body weight today- 208 lbs -----Sleep consult to see if sleep is affecting high blood pressure and heart arrhythmias.  Aware of loud snoring. ENT surgery for tonsils. No lung problems.Some daytime tiredness. Light caffeine user. Discussed the use of AI scribe software for clinical note transcription with the patient, who gave verbal consent to proceed. History of Present Illness   The patient, with a history of diabetes, irregular heartbeat, and high blood pressure, presents with concerns about his sleep. He reports loud snoring and waking up multiple times during the night to use the bathroom. He sleeps with multiple pillows and tends to roll from one side to the other during the night. The patient's father had similar sleep issues, including loud snoring and kicking in bed, which the patient believes may have been a manifestation of acting out dreams. The patient denies experiencing similar kicking episodes. He also mentions feeling tired during the day, particularly on Sundays, and sometimes falling asleep if not engaged in conversation. The patient has made changes to his caffeine intake on the advice of his heart doctor, which he speculates may be contributing to his sleep issues. ENT surgery for tonsils. Occasional light caffeine use. No sleep meds.    Assessment and Plan:    Suspected Obstructive Sleep Apnea (OSA) Symptoms and family history suggest OSA. Comorbid conditions include hypertension, arrhythmia, and diabetes, potentially exacerbated by untreated OSA. Explained long-term cardiovascular and metabolic risks. Discussed diagnosis and management importance, emphasizing CPAP as most effective. - Order home sleep test to evaluate for OSA. - Discuss CPAP, oral appliances, and surgical options if OSA is confirmed.  Hypertension Hypertension  may be exacerbated by untreated OSA, increasing cardiovascular risk.  Irregular Heartbeat Irregular heartbeat may be related to or exacerbated by OSA, increasing arrhythmia risk.  Diabetes Mellitus OSA may adversely affect glucose control and increase cardiovascular risk in diabetes.  Follow-up Prefers management through pulmonology clinic for comprehensive OSA care. - Follow up with pulmonology clinic after home sleep test results. - Instruct him to contact clinic for any issues or questions regarding sleep test or results.   05/17/23- 72 yoM former smoker followed for OSA, complicated by HTN, PVCs, GERD, DM2, Prostate Ca,  Body weight today- 206 lbs HST 03/26/23 - AHI (4%) 11.3/hr. Snoring with oxygen desaturation to a nadir of 88% and no sustained desaturation,  body weight 200 lbs  (Result in Media).  Discussed the use of AI scribe software for clinical note transcription with the patient, who gave verbal consent to proceed.  History of Present Illness   Kainan Langfitt. "Goble Last" is a 73 year old male who presents with concerns about snoring and potential sleep apnea.  He experiences loud snoring, which has led to him and his wife sleeping separately. He does not perceive his snoring as problematic and can return to sleep easily after waking. He recalls using a mouthpiece during his school years, which allowed him to breathe while wearing it. We reviewed his home sleep test which demonstrates mild sleep apnea (AHI 4% criterion 11.3/hr) We discussed treatment guidelines. In the absence of significant cardiovascular disease, treatment for mild OSA is directed at symptoms. We discussed CPAP and oral appliance options. He chooses to keep his weight down, sleep off his back, and return in a year to follow-up, unless he has concerns sooner.     Assessment and Plan:  Obstructive sleep apnea- mild Snoring mild, not currently problematic. Conservative options,  CPAP and dental appliances  discussed. He chooses conservative observation for now. No immediate cardiac concern, advised long-term observation. - Keep weight down, sleep off back - Re-evaluate in one year. - Contact clinic if symptoms worsen. - Discuss any changes in snoring with wife.    History of Present Illness  ROS-see HPI   + = positive Constitutional:    weight loss, night sweats, fevers, chills, fatigue, lassitude. HEENT:    headaches, difficulty swallowing, tooth/dental problems, sore throat,       sneezing, itching, ear ache, nasal congestion, post nasal drip, snoring CV:    chest pain, orthopnea, PND, swelling in lower extremities, anasarca,                                   dizziness, palpitations Resp:   shortness of breath with exertion or at rest.                productive cough,   non-productive cough, coughing up of blood.              change in color of mucus.  wheezing.   Skin:    rash or lesions. GI:  No-   heartburn, indigestion, abdominal pain, nausea, vomiting, diarrhea,                 change in bowel habits, loss of appetite GU: dysuria, change in color of urine, no urgency or frequency.   flank pain. MS:   joint pain, stiffness, decreased range of motion, back pain. Neuro-     nothing unusual Psych:  change in mood or affect.  depression or anxiety.   memory loss.  OBJ- Physical Exam General- Alert, Oriented, Affect-appropriate, Distress- none acute, +not obese Skin- rash-none, lesions- none, excoriation- none Lymphadenopathy- none Head- atraumatic            Eyes- Gross vision intact, PERRLA, conjunctivae and secretions clear            Ears- Hearing, canals-normal            Nose- Clear, no-Septal dev, mucus, polyps, erosion, perforation             Throat- Mallampati III , mucosa clear , drainage- none, tonsils- atrophic, +teeth Neck- flexible , trachea midline, no stridor , thyroid  nl, carotid no bruit Chest - symmetrical excursion , unlabored           Heart/CV- RRR , no  murmur , no gallop  , no rub, nl s1 s2                           - JVD- none , edema- none, stasis changes- none, varices- none           Lung- clear to P&A, wheeze- none, cough- none , dullness-none, rub- none           Chest wall-  Abd-  Br/ Gen/ Rectal- Not done, not indicated Extrem- cyanosis- none, clubbing, none, atrophy- none, strength- nl Neuro- grossly intact to observation

## 2023-05-17 NOTE — Patient Instructions (Addendum)
 You have mild sleep apnea. As discussed, we decide about treatment when scores are in this range, based on symptoms- such as loud snoring and daytime tiredness. We are also quicker to treat if there are significant heart problems.  For now, we have suggested you keep your weight down and sleep off the flat of your back.  We will get you back in a year to see how you are doing, unless you choose to be seen sooner.

## 2023-05-25 ENCOUNTER — Other Ambulatory Visit (HOSPITAL_COMMUNITY): Payer: Self-pay

## 2023-05-30 ENCOUNTER — Telehealth: Payer: Self-pay

## 2023-05-30 NOTE — Telephone Encounter (Addendum)
 This was attached to encounter dated 05/01/2023.  Copied from CRM [id number]. Topic: Medical Record Request - Other >> May 29, 2023  4:23 PM Jethro Morrison wrote: Reason for CRM: PT CALLED STATED DR YOUNG WAS NEEDING HIS RECORDS FROM DR Denman Fischer, HE PROVIDED THE FAX NUMBER [phone]. HE IS NEEDING OUR CLINIC TO FAX A RELEASE FORM SO WE CAN GET THOSE RECORDS TO DR YOUNG   Called patient.  Patient states he has had a home sleep test ordered by Dr. Del Favia and Dr. Linder Revere is requesting a copy.  Patient states this fax number he provided is to Dr. Denman Fischer.  Patient prefers to go by Dr. Diannia Forward office in [place] and sign a release of medical information at their office as he also lives in [place] rather than try to come to [place].  Patient will have Dr. Quentin Brunner office fax the home sleep study to Dr. Linder Revere at our alternate fax # [phone].

## 2023-05-30 NOTE — Telephone Encounter (Signed)
 Copied from CRM [id number]. Topic: Medical Record Request - Other >> May 29, 2023  4:23 PM Jethro Morrison wrote: Reason for CRM: PT CALLED STATED DR YOUNG WAS NEEDING HIS RECORDS FROM DR Denman Fischer, HE PROVIDED THE FAX NUMBER [phone]. HE IS NEEDING OUR CLINIC TO FAX A RELEASE FORM SO WE CAN GET THOSE RECORDS TO DR YOUNG

## 2023-06-01 NOTE — Telephone Encounter (Signed)
 Was received today in the mail.NFN

## 2023-06-05 ENCOUNTER — Other Ambulatory Visit (HOSPITAL_COMMUNITY): Payer: Self-pay

## 2023-06-05 MED ORDER — AMLODIPINE BESYLATE 10 MG PO TABS
10.0000 mg | ORAL_TABLET | Freq: Every day | ORAL | 1 refills | Status: AC
  Filled 2023-06-05 – 2023-11-26 (×2): qty 100, 100d supply, fill #0

## 2023-06-11 DIAGNOSIS — E669 Obesity, unspecified: Secondary | ICD-10-CM | POA: Diagnosis not present

## 2023-06-11 DIAGNOSIS — G4733 Obstructive sleep apnea (adult) (pediatric): Secondary | ICD-10-CM | POA: Diagnosis not present

## 2023-06-11 DIAGNOSIS — G8929 Other chronic pain: Secondary | ICD-10-CM | POA: Diagnosis not present

## 2023-06-11 DIAGNOSIS — M545 Low back pain, unspecified: Secondary | ICD-10-CM | POA: Diagnosis not present

## 2023-06-11 DIAGNOSIS — I10 Essential (primary) hypertension: Secondary | ICD-10-CM | POA: Diagnosis not present

## 2023-06-11 DIAGNOSIS — M549 Dorsalgia, unspecified: Secondary | ICD-10-CM | POA: Diagnosis not present

## 2023-06-15 DIAGNOSIS — M109 Gout, unspecified: Secondary | ICD-10-CM | POA: Diagnosis not present

## 2023-06-15 DIAGNOSIS — E1169 Type 2 diabetes mellitus with other specified complication: Secondary | ICD-10-CM | POA: Diagnosis not present

## 2023-06-15 DIAGNOSIS — E782 Mixed hyperlipidemia: Secondary | ICD-10-CM | POA: Diagnosis not present

## 2023-06-18 ENCOUNTER — Other Ambulatory Visit (HOSPITAL_COMMUNITY): Payer: Self-pay

## 2023-06-20 DIAGNOSIS — E119 Type 2 diabetes mellitus without complications: Secondary | ICD-10-CM | POA: Diagnosis not present

## 2023-06-22 ENCOUNTER — Other Ambulatory Visit: Payer: Self-pay

## 2023-06-22 ENCOUNTER — Other Ambulatory Visit (HOSPITAL_COMMUNITY): Payer: Self-pay

## 2023-06-22 DIAGNOSIS — M545 Low back pain, unspecified: Secondary | ICD-10-CM | POA: Diagnosis not present

## 2023-06-22 DIAGNOSIS — I10 Essential (primary) hypertension: Secondary | ICD-10-CM | POA: Diagnosis not present

## 2023-06-22 DIAGNOSIS — Z0001 Encounter for general adult medical examination with abnormal findings: Secondary | ICD-10-CM | POA: Diagnosis not present

## 2023-06-22 DIAGNOSIS — R944 Abnormal results of kidney function studies: Secondary | ICD-10-CM | POA: Diagnosis not present

## 2023-06-22 DIAGNOSIS — G4733 Obstructive sleep apnea (adult) (pediatric): Secondary | ICD-10-CM | POA: Diagnosis not present

## 2023-06-22 DIAGNOSIS — M549 Dorsalgia, unspecified: Secondary | ICD-10-CM | POA: Diagnosis not present

## 2023-06-22 DIAGNOSIS — E1169 Type 2 diabetes mellitus with other specified complication: Secondary | ICD-10-CM | POA: Diagnosis not present

## 2023-06-22 DIAGNOSIS — E1165 Type 2 diabetes mellitus with hyperglycemia: Secondary | ICD-10-CM | POA: Diagnosis not present

## 2023-06-22 DIAGNOSIS — R002 Palpitations: Secondary | ICD-10-CM | POA: Diagnosis not present

## 2023-06-22 DIAGNOSIS — G8929 Other chronic pain: Secondary | ICD-10-CM | POA: Diagnosis not present

## 2023-06-22 DIAGNOSIS — E782 Mixed hyperlipidemia: Secondary | ICD-10-CM | POA: Diagnosis not present

## 2023-06-22 DIAGNOSIS — D751 Secondary polycythemia: Secondary | ICD-10-CM | POA: Diagnosis not present

## 2023-06-22 MED ORDER — ALLOPURINOL 100 MG PO TABS
100.0000 mg | ORAL_TABLET | Freq: Every day | ORAL | 3 refills | Status: DC
  Filled 2023-07-23: qty 100, 100d supply, fill #0

## 2023-06-22 MED ORDER — METOPROLOL TARTRATE 100 MG PO TABS
100.0000 mg | ORAL_TABLET | Freq: Two times a day (BID) | ORAL | 3 refills | Status: AC
  Filled 2023-06-22: qty 180, 90d supply, fill #0
  Filled 2023-10-01: qty 180, 90d supply, fill #1
  Filled 2023-12-24: qty 180, 90d supply, fill #2

## 2023-06-22 MED ORDER — TELMISARTAN 80 MG PO TABS
80.0000 mg | ORAL_TABLET | Freq: Every day | ORAL | 2 refills | Status: DC
  Filled 2023-07-23: qty 90, 90d supply, fill #0

## 2023-06-22 MED ORDER — AMLODIPINE BESYLATE 10 MG PO TABS
10.0000 mg | ORAL_TABLET | Freq: Every day | ORAL | 2 refills | Status: DC
  Filled 2023-08-20: qty 100, 100d supply, fill #0

## 2023-07-02 ENCOUNTER — Ambulatory Visit: Admitting: Internal Medicine

## 2023-07-23 ENCOUNTER — Other Ambulatory Visit (HOSPITAL_COMMUNITY): Payer: Self-pay

## 2023-07-23 ENCOUNTER — Other Ambulatory Visit: Payer: Self-pay

## 2023-08-17 ENCOUNTER — Other Ambulatory Visit: Payer: PPO

## 2023-08-17 DIAGNOSIS — C61 Malignant neoplasm of prostate: Secondary | ICD-10-CM | POA: Diagnosis not present

## 2023-08-18 LAB — PSA: Prostate Specific Ag, Serum: 0.1 ng/mL (ref 0.0–4.0)

## 2023-08-20 ENCOUNTER — Other Ambulatory Visit: Payer: Self-pay

## 2023-08-21 ENCOUNTER — Ambulatory Visit: Payer: Self-pay | Admitting: Urology

## 2023-08-24 ENCOUNTER — Encounter: Payer: Self-pay | Admitting: Urology

## 2023-08-24 ENCOUNTER — Other Ambulatory Visit: Payer: Self-pay

## 2023-08-24 ENCOUNTER — Encounter: Payer: Self-pay | Admitting: Pharmacist

## 2023-08-24 ENCOUNTER — Ambulatory Visit: Payer: PPO | Admitting: Urology

## 2023-08-24 ENCOUNTER — Other Ambulatory Visit (HOSPITAL_COMMUNITY): Payer: Self-pay

## 2023-08-24 VITALS — BP 134/65 | HR 80

## 2023-08-24 DIAGNOSIS — R351 Nocturia: Secondary | ICD-10-CM

## 2023-08-24 DIAGNOSIS — C61 Malignant neoplasm of prostate: Secondary | ICD-10-CM | POA: Diagnosis not present

## 2023-08-24 DIAGNOSIS — N401 Enlarged prostate with lower urinary tract symptoms: Secondary | ICD-10-CM | POA: Diagnosis not present

## 2023-08-24 DIAGNOSIS — N138 Other obstructive and reflux uropathy: Secondary | ICD-10-CM

## 2023-08-24 LAB — MICROSCOPIC EXAMINATION
Bacteria, UA: NONE SEEN
WBC, UA: NONE SEEN /HPF (ref 0–5)

## 2023-08-24 LAB — URINALYSIS, ROUTINE W REFLEX MICROSCOPIC
Bilirubin, UA: NEGATIVE
Ketones, UA: NEGATIVE
Leukocytes,UA: NEGATIVE
Nitrite, UA: NEGATIVE
Specific Gravity, UA: 1.025 (ref 1.005–1.030)
Urobilinogen, Ur: 0.2 mg/dL (ref 0.2–1.0)
pH, UA: 5.5 (ref 5.0–7.5)

## 2023-08-24 MED ORDER — GEMTESA 75 MG PO TABS
1.0000 | ORAL_TABLET | Freq: Every day | ORAL | 11 refills | Status: AC
  Filled 2023-08-24: qty 30, 30d supply, fill #0

## 2023-08-24 NOTE — Progress Notes (Signed)
 08/24/2023 9:38 AM   Willie Oneal. 20-Jul-1950 [id number]  Referring provider: Shona Norleen PEDLAR, MD [address] [place],  [place] [id number]  Followup prostate cancer   HPI: Mr Willie Oneal is a 72yo here for followup for BPh and prostate cancer. PSA decreased 0.1. IPSS 17 QOL 3. He is off his gemtesa . He continue to have bothersome urinary frequency, urgency and nocturia 3-4x. He remains on farxiga. [place] stream strong. He is an umbilcial hernia he wants to have repaired   PMH: Past Medical History:  Diagnosis Date   Diabetes mellitus type 2    GERD (gastroesophageal reflux disease)    History of COVID-19    jan/feb 2020   History of COVID-19 01/2020   mild symptoms all symptoms all symptoms resolved   History of gout    none in years   History of kidney stones    passed on won at least 15 yrs ago per pt on 03-15-2021   HTN (hypertension)    Hyperlipidemia    OsteoArthritis    Prostate cancer [facility])     Surgical History: Past Surgical History:  Procedure Laterality Date   BIOPSY  11/27/2022   Procedure: BIOPSY;  Surgeon: Willie Carlin POUR, DO;  [facility];  Service: Endoscopy;;   COLONOSCOPY N/A 07/21/2015   Procedure: COLONOSCOPY;  Surgeon: Willie RAYMOND Rivet, MD;  [facility];  Service: Endoscopy;  Laterality: N/A;  730   COLONOSCOPY WITH PROPOFOL  N/A 11/27/2022   Procedure: COLONOSCOPY WITH PROPOFOL ;  Surgeon: Willie Carlin POUR, DO;  [facility];  Service: Endoscopy;  Laterality: N/A;  1:30 PM, ASA 3   CYSTOSCOPY N/A 03/17/2021   Procedure: CYSTOSCOPY;  Surgeon: Willie Belvie CROME, MD;  Location[facility];  Service: Urology;  Laterality: N/A;   ESOPHAGOGASTRODUODENOSCOPY (EGD) WITH PROPOFOL  N/A 11/27/2022   Procedure: ESOPHAGOGASTRODUODENOSCOPY (EGD) WITH PROPOFOL ;  Surgeon: Willie Carlin POUR, DO;  [facility];  Service: Endoscopy;  Laterality: N/A;   left shoudler surgery     in high school result of  football accident   POLYPECTOMY  11/27/2022   Procedure: POLYPECTOMY;  Surgeon: Willie Carlin POUR, DO;  [facility];  Service: Endoscopy;;   RADIOACTIVE SEED IMPLANT N/A 03/17/2021   Procedure: RADIOACTIVE SEED IMPLANT/BRACHYTHERAPY IMPLANT;  Surgeon: Willie Belvie CROME, MD;  Location[facility];  Service: Urology;  Laterality: N/A;   right shoulder arthroplasty  1999   right shoulder exploration and implantation of antibiotic spacer 05-02-2010     TONSILLECTOMY  1963   and adenoids   vasectomy  1995    Home Medications:  Allergies as of 08/24/2023       Reactions   Penicillins    Unspecified reaction as child        Medication List        Accurate as of August 24, 2023  9:38 AM. If you have any questions, ask your nurse or doctor.          allopurinol  100 MG tablet Commonly known as: ZYLOPRIM  Take 100 mg by mouth daily.   allopurinol  100 MG tablet Commonly known as: ZYLOPRIM  Take 1 tablet (100 mg total) by mouth daily.   allopurinol  100 MG tablet Commonly known as: ZYLOPRIM  Take 1 tablet (100 mg total) by mouth daily.   allopurinol  100 MG tablet Commonly known as: ZYLOPRIM  Take 1 tablet (100 mg total) by mouth daily.   allopurinol  100 MG tablet Commonly known  as: ZYLOPRIM  Take 1 tablet (100 mg total) by mouth daily.   amLODipine  10 MG tablet Commonly known as: NORVASC  Take 1 tablet (10 mg total) by mouth daily.   amLODipine  10 MG tablet Commonly known as: NORVASC  Take 1 tablet (10 mg total) by mouth daily.   Farxiga 10 MG Tabs tablet Generic drug: dapagliflozin propanediol Take by mouth daily.   Gemtesa  75 MG Tabs Generic drug: Vibegron  Take 1 tablet (75 mg total) by mouth daily.   glipiZIDE  10 MG 24 hr tablet Commonly known as: GLUCOTROL  XL Take 10 mg by mouth daily with breakfast.   glipiZIDE  10 MG 24 hr tablet Commonly known as: glipiZIDE  XL Take 1 tablet (10 mg total) by mouth 2 (two) times daily.   glipiZIDE   10 MG 24 hr tablet Commonly known as: GLUCOTROL  XL Take 1 tablet (10 mg total) by mouth 2 (two) times daily.   lisinopril  20 MG tablet Commonly known as: ZESTRIL  Take 20 mg by mouth daily.   lisinopril  20 MG tablet Commonly known as: ZESTRIL  Take 1 tablet (20 mg total) by mouth daily.   lisinopril  20 MG tablet Commonly known as: ZESTRIL  Take 1 tablet (20 mg total) by mouth daily.   metFORMIN  500 MG 24 hr tablet Commonly known as: GLUCOPHAGE -XR Take 1,000 mg by mouth 2 (two) times daily.   metFORMIN  500 MG 24 hr tablet Commonly known as: GLUCOPHAGE -XR Take 2 tablets (1,000 mg total) by mouth 2 (two) times daily.   metoprolol  tartrate 50 MG tablet Commonly known as: LOPRESSOR  Take 1 tablet (50 mg total) by mouth 2 (two) times daily.   metoprolol  tartrate 100 MG tablet Commonly known as: LOPRESSOR  Take 1 tablet (100 mg total) by mouth 2 (two) times daily.   mexiletine 200 MG capsule Commonly known as: MEXITIL  Take 1 capsule (200 mg total) by mouth 3 (three) times daily.   OneTouch Delica Plus Lancet30G Misc   OneTouch Delica Plus Lancet30G Misc use as directed   OneTouch Delica Plus Lancet33G Misc Use as directed.   OneTouch Verio test strip Generic drug: glucose blood 1 each 2 (two) times daily.   OneTouch Verio test strip Generic drug: glucose blood use to check blood sugar 1-2 times daily   oxyCODONE -acetaminophen  5-325 MG tablet Commonly known as: PERCOCET/ROXICET Take 1 tablet by mouth every 6 (six) hours as needed for severe pain (pain score 7-10).   pantoprazole  20 MG tablet Commonly known as: PROTONIX  Take 20 mg by mouth 2 (two) times daily.   pantoprazole  20 MG tablet Commonly known as: PROTONIX  Take 1 tablet (20 mg total) by mouth 2 (two) times daily.   pantoprazole  20 MG tablet Commonly known as: PROTONIX  Take 1 tablet (20 mg total) by mouth 2 (two) times daily.   simvastatin  20 MG tablet Commonly known as: ZOCOR  Take 20 mg by mouth  daily.   simvastatin  40 MG tablet Commonly known as: ZOCOR  Take 1 tablet (40 mg total) by mouth daily.   telmisartan  80 MG tablet Commonly known as: MICARDIS  Take 1 tablet (80 mg total) by mouth daily.   telmisartan  80 MG tablet Commonly known as: MICARDIS  Take 1 tablet (80 mg total) by mouth daily.   telmisartan  80 MG tablet Commonly known as: MICARDIS  Take 1 tablet (80 mg total) by mouth daily.        Allergies:  Allergies  Allergen Reactions   Penicillins     Unspecified reaction as child    Family History: No family history on file.  Social History:  reports that he quit smoking about 23 years ago. His smoking use included cigarettes. He started smoking about 53 years ago. He has a 30 pack-year smoking history. He has never used smokeless tobacco. He reports that he does not drink alcohol and does not use drugs.  ROS: All other review of systems were reviewed and are negative except what is noted above in HPI  Physical Exam: BP 134/65   Pulse 80   Constitutional:  Alert and oriented, No acute distress. HEENT: [place] AT, moist mucus membranes.  Trachea midline, no masses. Cardiovascular: No clubbing, cyanosis, or edema. Respiratory: Normal respiratory effort, no increased work of breathing. GI: Abdomen is soft, nontender, nondistended, no abdominal masses GU: No CVA tenderness.  Lymph: No cervical or inguinal lymphadenopathy. Skin: No rashes, bruises or suspicious lesions. Neurologic: Grossly intact, no focal deficits, moving all 4 extremities. Psychiatric: Normal mood and affect.  Laboratory Data: Lab Results  Component Value Date   WBC 6.2 04/23/2023   HGB 16.7 04/23/2023   HCT 47.1 04/23/2023   MCV 85.3 04/23/2023   PLT 174 04/23/2023    Lab Results  Component Value Date   CREATININE 0.88 04/23/2023    No results found for: PSA  No results found for: TESTOSTERONE  No results found for: HGBA1C  Urinalysis    Component Value Date/Time    COLORURINE YELLOW 04/23/2023 1010   APPEARANCEUR HAZY (A) 04/23/2023 1010   APPEARANCEUR Clear 02/21/2023 1051   LABSPEC 1.026 04/23/2023 1010   PHURINE 5.0 04/23/2023 1010   GLUCOSEU >=500 (A) 04/23/2023 1010   HGBUR NEGATIVE 04/23/2023 1010   BILIRUBINUR NEGATIVE 04/23/2023 1010   BILIRUBINUR Negative 02/21/2023 1051   KETONESUR NEGATIVE 04/23/2023 1010   PROTEINUR 100 (A) 04/23/2023 1010   UROBILINOGEN 0.2 05/02/2011 0634   NITRITE NEGATIVE 04/23/2023 1010   LEUKOCYTESUR NEGATIVE 04/23/2023 1010    Lab Results  Component Value Date   LABMICR See below: 02/21/2023   WBCUA None seen 02/21/2023   LABEPIT 0-10 02/21/2023   MUCUS Present (A) 05/05/2022   BACTERIA NONE SEEN 04/23/2023    Pertinent Imaging:  Results for orders placed in visit on 04/24/23  DG Abd 1 View  Narrative CLINICAL DATA:  Kidney stone.  Left-sided pain.  EXAM: ABDOMEN - 1 VIEW  COMPARISON:  CT 2 days ago 04/23/2023.  FINDINGS: Left renal calculus on CT is not seen on the current exam. Contrast in the colon from recent CT. Vascular and seminal vesicle calcifications. Nonobstructive bowel gas pattern, small to moderate colonic stool burden. Brachytherapy seeds in the prostate.  IMPRESSION: Left renal calculus on CT is not seen on the current exam.   Electronically Signed By: Andrea Gasman M.D. On: 04/25/2023 13:18  No results found for this or any previous visit.  No results found for this or any previous visit.  No results found for this or any previous visit.  No results found for this or any previous visit.  No results found for this or any previous visit.  No results found for this or any previous visit.  No results found for this or any previous visit.   Assessment & Plan:    1. Prostate cancer (HCC) (Primary) -followup 6 months with a PSA  - Urinalysis, Routine w reflex microscopic  2. Benign prostatic hyperplasia with urinary obstruction -gemtesa  75mg  daily  3.  Nocturia Gemtesa  75mg  Daily   No follow-ups on file.  Belvie Clara, MD  [facility] [place]

## 2023-08-24 NOTE — Patient Instructions (Signed)

## 2023-08-28 ENCOUNTER — Other Ambulatory Visit: Payer: Self-pay

## 2023-09-21 ENCOUNTER — Other Ambulatory Visit (HOSPITAL_COMMUNITY): Payer: Self-pay

## 2023-09-21 MED ORDER — METFORMIN HCL ER 500 MG PO TB24
1000.0000 mg | ORAL_TABLET | Freq: Two times a day (BID) | ORAL | 1 refills | Status: DC
  Filled 2023-09-21: qty 400, 100d supply, fill #0

## 2023-09-21 MED ORDER — GLIPIZIDE ER 10 MG PO TB24
10.0000 mg | ORAL_TABLET | Freq: Two times a day (BID) | ORAL | 1 refills | Status: DC
  Filled 2023-09-21: qty 200, 100d supply, fill #0

## 2023-10-01 ENCOUNTER — Other Ambulatory Visit: Payer: Self-pay

## 2023-10-08 DIAGNOSIS — E782 Mixed hyperlipidemia: Secondary | ICD-10-CM | POA: Diagnosis not present

## 2023-10-08 DIAGNOSIS — D751 Secondary polycythemia: Secondary | ICD-10-CM | POA: Diagnosis not present

## 2023-10-08 DIAGNOSIS — M109 Gout, unspecified: Secondary | ICD-10-CM | POA: Diagnosis not present

## 2023-10-08 DIAGNOSIS — E1169 Type 2 diabetes mellitus with other specified complication: Secondary | ICD-10-CM | POA: Diagnosis not present

## 2023-10-11 ENCOUNTER — Encounter: Payer: Self-pay | Admitting: Internal Medicine

## 2023-10-11 DIAGNOSIS — G8929 Other chronic pain: Secondary | ICD-10-CM | POA: Diagnosis not present

## 2023-10-11 DIAGNOSIS — K4090 Unilateral inguinal hernia, without obstruction or gangrene, not specified as recurrent: Secondary | ICD-10-CM | POA: Diagnosis not present

## 2023-10-11 DIAGNOSIS — D751 Secondary polycythemia: Secondary | ICD-10-CM | POA: Diagnosis not present

## 2023-10-11 DIAGNOSIS — C61 Malignant neoplasm of prostate: Secondary | ICD-10-CM | POA: Diagnosis not present

## 2023-10-11 DIAGNOSIS — R339 Retention of urine, unspecified: Secondary | ICD-10-CM | POA: Diagnosis not present

## 2023-10-11 DIAGNOSIS — E1169 Type 2 diabetes mellitus with other specified complication: Secondary | ICD-10-CM | POA: Diagnosis not present

## 2023-10-11 DIAGNOSIS — R002 Palpitations: Secondary | ICD-10-CM | POA: Diagnosis not present

## 2023-10-11 DIAGNOSIS — G4733 Obstructive sleep apnea (adult) (pediatric): Secondary | ICD-10-CM | POA: Diagnosis not present

## 2023-10-11 DIAGNOSIS — I10 Essential (primary) hypertension: Secondary | ICD-10-CM | POA: Diagnosis not present

## 2023-10-11 DIAGNOSIS — E782 Mixed hyperlipidemia: Secondary | ICD-10-CM | POA: Diagnosis not present

## 2023-10-11 DIAGNOSIS — R944 Abnormal results of kidney function studies: Secondary | ICD-10-CM | POA: Diagnosis not present

## 2023-10-11 DIAGNOSIS — M549 Dorsalgia, unspecified: Secondary | ICD-10-CM | POA: Diagnosis not present

## 2023-10-17 ENCOUNTER — Encounter (INDEPENDENT_AMBULATORY_CARE_PROVIDER_SITE_OTHER): Payer: Self-pay | Admitting: Gastroenterology

## 2023-10-30 ENCOUNTER — Other Ambulatory Visit (HOSPITAL_COMMUNITY): Payer: Self-pay

## 2023-10-31 ENCOUNTER — Other Ambulatory Visit: Payer: Self-pay

## 2023-11-01 ENCOUNTER — Other Ambulatory Visit (HOSPITAL_COMMUNITY): Payer: Self-pay

## 2023-11-01 ENCOUNTER — Other Ambulatory Visit: Payer: Self-pay

## 2023-11-01 MED ORDER — ONETOUCH VERIO VI STRP
1.0000 | ORAL_STRIP | Freq: Two times a day (BID) | 5 refills | Status: AC
Start: 2023-11-01 — End: ?
  Filled 2023-11-01: qty 200, 100d supply, fill #0

## 2023-11-23 ENCOUNTER — Ambulatory Visit: Attending: Internal Medicine | Admitting: Internal Medicine

## 2023-11-23 ENCOUNTER — Other Ambulatory Visit (HOSPITAL_COMMUNITY): Payer: Self-pay

## 2023-11-23 ENCOUNTER — Encounter: Payer: Self-pay | Admitting: Internal Medicine

## 2023-11-23 VITALS — BP 120/72 | HR 73 | Ht 70.5 in | Wt 203.4 lb

## 2023-11-23 DIAGNOSIS — I493 Ventricular premature depolarization: Secondary | ICD-10-CM | POA: Diagnosis not present

## 2023-11-23 MED ORDER — MEXILETINE HCL 200 MG PO CAPS: 200.0000 mg | ORAL_CAPSULE | Freq: Two times a day (BID) | ORAL | 3 refills | Status: DC

## 2023-11-23 MED ORDER — MEXILETINE HCL 200 MG PO CAPS
200.0000 mg | ORAL_CAPSULE | Freq: Two times a day (BID) | ORAL | 3 refills | Status: AC
  Filled 2023-11-23 – 2023-12-24 (×2): qty 180, 90d supply, fill #0

## 2023-11-23 NOTE — Progress Notes (Signed)
 Cardiology Office Note  Date: 11/23/2023   ID: Willie Bob., DOB [birth year], MRN [id number]  PCP:  Shona Norleen PEDLAR, MD  Cardiologist:  Diannah SHAUNNA Maywood, MD Electrophysiologist:  None    History of Present Illness: Willie Forand. is a 73 y.o. male known to have HTN, DM 2, HLD, 22.2% PVC burden is here for f/u visit.  Patient was referred to cardiology clinic for evaluation of palpitations, event monitor from 1/24 showed 22.2% PVC burden.  Atenolol  was switched to metoprolol  to tartrate 50 mg twice daily, later that was switched to 100 mg twice daily by Dr. Shona.  He underwent repeat event monitor in February 2025 that showed 24.8% PVC burden, 75 runs of NSVT and has been asymptomatic.  Mexiletine 200 mg TID started and EKG today showed NSR with no evidence of PVCs.  He reports having no symptoms either.  Doing great overall.  Sometimes he takes mexiletine twice a day and sometimes 3 times a day.  CT cardiac coronary calcium scoring in 2024 showed coronary calcium score of 0.  Past Medical History:  Diagnosis Date   Diabetes mellitus type 2    GERD (gastroesophageal reflux disease)    History of COVID-19    jan/feb 2020   History of COVID-19 01/2020   mild symptoms all symptoms all symptoms resolved   History of gout    none in years   History of kidney stones    passed on won at least 15 yrs ago per pt on 03-15-2021   HTN (hypertension)    Hyperlipidemia    OsteoArthritis    Prostate cancer [facility])     Past Surgical History:  Procedure Laterality Date   BIOPSY  11/27/2022   Procedure: BIOPSY;  Surgeon: Cindie Carlin POUR, DO;  [facility];  Service: Endoscopy;;   COLONOSCOPY N/A 07/21/2015   Procedure: COLONOSCOPY;  Surgeon: Claudis RAYMOND Rivet, MD;  [facility];  Service: Endoscopy;  Laterality: N/A;  730   COLONOSCOPY WITH PROPOFOL  N/A 11/27/2022   Procedure: COLONOSCOPY WITH PROPOFOL ;  Surgeon: Cindie Carlin POUR, DO;  [facility];   Service: Endoscopy;  Laterality: N/A;  1:30 PM, ASA 3   CYSTOSCOPY N/A 03/17/2021   Procedure: CYSTOSCOPY;  Surgeon: Sherrilee Belvie CROME, MD;  Location: [facility];  Service: Urology;  Laterality: N/A;   ESOPHAGOGASTRODUODENOSCOPY (EGD) WITH PROPOFOL  N/A 11/27/2022   Procedure: ESOPHAGOGASTRODUODENOSCOPY (EGD) WITH PROPOFOL ;  Surgeon: Cindie Carlin POUR, DO;  [facility];  Service: Endoscopy;  Laterality: N/A;   left shoudler surgery     in high school result of football accident   POLYPECTOMY  11/27/2022   Procedure: POLYPECTOMY;  Surgeon: Cindie Carlin POUR, DO;  [facility];  Service: Endoscopy;;   RADIOACTIVE SEED IMPLANT N/A 03/17/2021   Procedure: RADIOACTIVE SEED IMPLANT/BRACHYTHERAPY IMPLANT;  Surgeon: Sherrilee Belvie CROME, MD;  Location: [facility];  Service: Urology;  Laterality: N/A;   right shoulder arthroplasty  1999   right shoulder exploration and implantation of antibiotic spacer 05-02-2010     TONSILLECTOMY  1963   and adenoids   vasectomy  1995    Current Outpatient Medications  Medication Sig Dispense Refill   allopurinol  (ZYLOPRIM ) 100 MG tablet Take 100 mg by mouth daily.     allopurinol  (ZYLOPRIM ) 100 MG tablet Take 1 tablet (100 mg total) by mouth daily. 90 tablet 3   allopurinol  (ZYLOPRIM ) 100 MG tablet Take 1 tablet (100  mg total) by mouth daily. 90 tablet 3   allopurinol  (ZYLOPRIM ) 100 MG tablet Take 1 tablet (100 mg total) by mouth daily. 100 tablet 3   allopurinol  (ZYLOPRIM ) 100 MG tablet Take 1 tablet (100 mg total) by mouth daily. 100 tablet 3   amLODipine  (NORVASC ) 10 MG tablet Take 1 tablet (10 mg total) by mouth daily. 100 tablet 1   amLODipine  (NORVASC ) 10 MG tablet Take 1 tablet (10 mg total) by mouth daily. 100 tablet 2   dapagliflozin propanediol (FARXIGA) 10 MG TABS tablet Take by mouth daily.     glipiZIDE  (GLIPIZIDE  XL) 10 MG 24 hr tablet Take 1 tablet (10 mg total) by mouth 2 (two) times daily. 180  tablet 3   glipiZIDE  (GLUCOTROL  XL) 10 MG 24 hr tablet Take 10 mg by mouth daily with breakfast.     glipiZIDE  (GLUCOTROL  XL) 10 MG 24 hr tablet Take 1 tablet (10 mg total) by mouth 2 (two) times daily. 180 tablet 0   glipiZIDE  (GLUCOTROL  XL) 10 MG 24 hr tablet Take 1 tablet (10 mg total) by mouth 2 (two) times daily. 200 tablet 1   glucose blood (ONETOUCH VERIO) test strip use to check blood sugars 1-2 times daily 200 each 5   Lancets (ONETOUCH DELICA PLUS LANCET30G) MISC      Lancets (ONETOUCH DELICA PLUS LANCET30G) MISC use as directed 200 each 2   lisinopril  (ZESTRIL ) 20 MG tablet Take 20 mg by mouth daily. (Patient not taking: Reported on 05/17/2023)     lisinopril  (ZESTRIL ) 20 MG tablet Take 1 tablet (20 mg total) by mouth daily. (Patient not taking: Reported on 05/17/2023) 90 tablet 3   lisinopril  (ZESTRIL ) 20 MG tablet Take 1 tablet (20 mg total) by mouth daily. (Patient not taking: Reported on 05/17/2023) 90 tablet 3   metFORMIN  (GLUCOPHAGE -XR) 500 MG 24 hr tablet Take 1,000 mg by mouth 2 (two) times daily.     metFORMIN  (GLUCOPHAGE -XR) 500 MG 24 hr tablet Take 2 tablets (1,000 mg total) by mouth 2 (two) times daily. 180 tablet 1   metFORMIN  (GLUCOPHAGE -XR) 500 MG 24 hr tablet Take 2 tablets (1,000 mg total) by mouth 2 (two) times daily. 400 tablet 1   metoprolol  tartrate (LOPRESSOR ) 100 MG tablet Take 1 tablet (100 mg total) by mouth 2 (two) times daily. 180 tablet 3   metoprolol  tartrate (LOPRESSOR ) 50 MG tablet Take 1 tablet (50 mg total) by mouth 2 (two) times daily. 180 tablet 3   mexiletine (MEXITIL ) 200 MG capsule Take 1 capsule (200 mg total) by mouth 3 (three) times daily. 270 capsule 3   OneTouch Delica Lancets 33G MISC Use as directed. 100 each 2   ONETOUCH VERIO test strip 1 each 2 (two) times daily.     oxyCODONE -acetaminophen  (PERCOCET/ROXICET) 5-325 MG tablet Take 1 tablet by mouth every 6 (six) hours as needed for severe pain (pain score 7-10). 6 tablet 0   pantoprazole   (PROTONIX ) 20 MG tablet Take 20 mg by mouth 2 (two) times daily.     pantoprazole  (PROTONIX ) 20 MG tablet Take 1 tablet (20 mg total) by mouth 2 (two) times daily. 180 tablet 3   pantoprazole  (PROTONIX ) 20 MG tablet Take 1 tablet (20 mg total) by mouth 2 (two) times daily. 180 tablet 0   simvastatin  (ZOCOR ) 20 MG tablet Take 20 mg by mouth daily.     simvastatin  (ZOCOR ) 40 MG tablet Take 1 tablet (40 mg total) by mouth daily. 90 tablet 2   telmisartan  (  MICARDIS ) 80 MG tablet Take 1 tablet (80 mg total) by mouth daily. 30 tablet 2   telmisartan  (MICARDIS ) 80 MG tablet Take 1 tablet (80 mg total) by mouth daily. 30 tablet 2   telmisartan  (MICARDIS ) 80 MG tablet Take 1 tablet (80 mg total) by mouth daily. 100 tablet 2   Vibegron  (GEMTESA ) 75 MG TABS Take 1 tablet (75 mg total) by mouth daily. 30 tablet 11   No current facility-administered medications for this visit.   Allergies:  Penicillins   Social History: The patient  reports that he quit smoking about 23 years ago. His smoking use included cigarettes. He started smoking about 53 years ago. He has a 30 pack-year smoking history. He has never used smokeless tobacco. He reports that he does not drink alcohol and does not use drugs.   Family History: The patient's family history is not on file.   ROS:  Please see the history of present illness. Otherwise, complete review of systems is positive for none.  All other systems are reviewed and negative.   Physical Exam: VS:  There were no vitals taken for this visit., BMI There is no height or weight on file to calculate BMI.  Wt Readings from Last 3 Encounters:  05/17/23 206 lb (93.4 kg)  04/23/23 200 lb (90.7 kg)  03/26/23 208 lb 6.4 oz (94.5 kg)    General: Patient appears comfortable at rest. HEENT: Conjunctiva and lids normal, oropharynx clear with moist mucosa. Neck: Supple, no elevated JVP or carotid bruits, no thyromegaly. Lungs: Clear to auscultation, nonlabored breathing at  rest. Cardiac: Regular rate and rhythm, no S3 or significant systolic murmur, no pericardial rub. Abdomen: Soft, nontender, no hepatomegaly, bowel sounds present, no guarding or rebound. Extremities: No pitting edema, distal pulses 2+. Skin: Warm and dry. Musculoskeletal: No kyphosis. Neuropsychiatric: Alert and oriented x3, affect grossly appropriate.  ECG:  An ECG dated 01/19/2022 was personally reviewed today and demonstrated:  Normal sinus rhythm and frequent PVCs  Recent Labwork: 04/23/2023: ALT 16; AST 19; BUN 11; Creatinine, Ser 0.88; Hemoglobin 16.7; Platelets 174; Potassium 3.7; Sodium 138  No results found for: CHOL, TRIG, HDL, CHOLHDL, VLDL, LDLCALC, LDLDIRECT  Other Studies Reviewed Today:   Assessment and Plan:  Frequent PVC - Asymptomatic.  EKG today showed NSR.  No PVCs. - Event monitor from 2/25 showed 24.8% PVC burden and 1/24 showed 22.2% PVC burden. - Initially on atenolol  that was later switched to metoprolol  at rate of 50 mg twice daily, dose increased to 100 mg twice daily by Dr. Shona.  Currently on mexiletine 200 mg TID.  Sometimes he takes twice a day and sometimes 3 times a day.  Will switch to mexiletine 200 mg twice daily. - Coronary calcium score is 0 from 2024 but this was CT coronary calcium scoring.  Cannot rule out soft plaque. due to risk factors of HTN, DM 2, will need to definitely rule out CAD by obtaining CT cardiac.  Obtain CT cardiac.  # HTN, controlled -Continue lisinopril  10 mg once daily, metoprolol  titrate 50 mg twice daily.-Continue amlodipine  10 mg once daily, metoprolol  tartrate 100 mg twice daily, telmisartan  80 mg once daily.  Follows with PCP.  # HLD, at goal - Continue simvastatin  40 mg nightly.  Goal LDL is less than 100.  I reviewed lipid panel from October 2025.  LDL 37 and TG 290.  LDL at goal.  TG mildly elevated.  Heart healthy diet and exercise.  30-minute spent in reviewing prior medical  records, reports, more than  3 labs, discussion and documentation.  Medication Adjustments/Labs and Tests Ordered: Current medicines are reviewed at length with the patient today.  Concerns regarding medicines are outlined above.   Tests Ordered: Orders Placed This Encounter  Procedures   EKG 12-Lead    Medication Changes: No orders of the defined types were placed in this encounter.   Disposition:  Follow up 1 year  Signed, Willie Calvert Arleta Maywood, MD, 11/23/2023 9:06 AM    [facility] at [facility] [address] [place], [name] [address]

## 2023-11-23 NOTE — Patient Instructions (Signed)
 Medication Instructions:   DECREASE Mexitil  to 200 mg Twice a day   Labwork: None today  Testing/Procedures: Your physician has requested that you have cardiac CT. Cardiac computed tomography (CT) is a painless test that uses an x-ray machine to take clear, detailed pictures of your heart. For further information please visit [email or web address] Please follow instruction sheet as given.    Follow-Up: 1 year   Any Other Special Instructions Will Be Listed Below (If Applicable).  If you need a refill on your cardiac medications before your next appointment, please call your pharmacy.      Your cardiac CT will be scheduled at one of the below locations:   [facility] [address] [place], [name] [address] [phone] (Severe contrast allergies only)  OR   [facility] [address]  [place], [name] [address]   If scheduled at [facility], please arrive at the [facility] and Children's Entrance (Entrance C2) of [facility] 30 minutes prior to test start time. You can use the FREE valet parking offered at entrance C (encouraged to control the heart rate for the test)  Proceed to the [facility] Radiology Department (first floor) to check-in and test prep.  All radiology patients and guests should use entrance C2 at [facility], accessed from [facility], even though the hospital's physical address listed is [address]  If scheduled at the [facility] Tower at [address], please enter the parking lot using the [address] entrance and use the FREE valet service at the patient drop-off area. Enter the building and check-in with registration on the main floor.   Please follow these instructions carefully (unless otherwise directed):  An IV will be required for this test and Nitroglycerin will be given.  Hold all erectile dysfunction medications at least 3 days (72  hrs) prior to test. (Ie viagra, cialis, sildenafil, tadalafil, etc)   On the Night Before the Test: Be sure to Drink plenty of water . Do not consume any caffeinated/decaffeinated beverages or chocolate 12 hours prior to your test. Do not take any antihistamines 12 hours prior to your test.   On the Day of the Test: Drink plenty of water  until 1 hour prior to the test. Do not eat any food 1 hour prior to test. You may take your regular medications prior to the test.  Take metoprolol  (Lopressor ) two hours prior to test. If you take Furosemide/Hydrochlorothiazide/Spironolactone/Chlorthalidone, please HOLD on the morning of the test. Patients who wear a continuous glucose monitor MUST remove the device prior to scanning.        After the Test: Drink plenty of water . After receiving IV contrast, you may experience a mild flushed feeling. This is normal. On occasion, you may experience a mild rash up to 24 hours after the test. This is not dangerous. If this occurs, you can take Benadryl 25 mg, Zyrtec, Claritin, or Allegra and increase your fluid intake. (Patients taking Tikosyn should avoid Benadryl, and may take Zyrtec, Claritin, or Allegra) If you experience trouble breathing, this can be serious. If it is severe call 911 IMMEDIATELY. If it is mild, please call our office.  We will call to schedule your test 2-4 weeks out understanding that some insurance companies will need an authorization prior to the service being performed.   For more information and frequently asked questions, please visit our website : [email or web address]  For non-scheduling related questions, please contact the  cardiac imaging nurse navigator should you have any questions/concerns: Cardiac Imaging Nurse Navigators Direct Office Dial: [phone]   For scheduling needs, including cancellations and rescheduling, please call Brittany, [phone].

## 2023-11-26 ENCOUNTER — Other Ambulatory Visit (HOSPITAL_COMMUNITY): Payer: Self-pay

## 2023-11-26 ENCOUNTER — Telehealth: Payer: Self-pay | Admitting: Internal Medicine

## 2023-11-26 ENCOUNTER — Other Ambulatory Visit: Payer: Self-pay

## 2023-11-26 DIAGNOSIS — I493 Ventricular premature depolarization: Secondary | ICD-10-CM

## 2023-11-26 MED ORDER — ALLOPURINOL 100 MG PO TABS
100.0000 mg | ORAL_TABLET | Freq: Every day | ORAL | 1 refills | Status: AC
  Filled 2023-11-26: qty 100, 100d supply, fill #0

## 2023-11-26 MED ORDER — TELMISARTAN 80 MG PO TABS
80.0000 mg | ORAL_TABLET | Freq: Every day | ORAL | 2 refills | Status: AC
  Filled 2023-11-26: qty 30, 30d supply, fill #0
  Filled 2023-12-24: qty 30, 30d supply, fill #1
  Filled 2024-01-22: qty 30, 30d supply, fill #2

## 2023-11-26 NOTE — Telephone Encounter (Signed)
  Patient said he was told he need blood work before his CT morph. No order on file

## 2023-11-26 NOTE — Telephone Encounter (Signed)
Lab order entered. Patient notified.

## 2023-12-03 ENCOUNTER — Other Ambulatory Visit (HOSPITAL_COMMUNITY)
Admission: RE | Admit: 2023-12-03 | Discharge: 2023-12-03 | Disposition: A | Source: Ambulatory Visit | Attending: Internal Medicine | Admitting: Internal Medicine

## 2023-12-03 DIAGNOSIS — I493 Ventricular premature depolarization: Secondary | ICD-10-CM | POA: Insufficient documentation

## 2023-12-03 LAB — BASIC METABOLIC PANEL WITH GFR
Anion gap: 12 (ref 5–15)
BUN: 13 mg/dL (ref 8–23)
CO2: 25 mmol/L (ref 22–32)
Calcium: 9.4 mg/dL (ref 8.9–10.3)
Chloride: 104 mmol/L (ref 98–111)
Creatinine, Ser: 1.14 mg/dL (ref 0.61–1.24)
GFR, Estimated: >60 mL/min (ref >60)
Glucose, Bld: 210 mg/dL — ABNORMAL HIGH (ref 70–99)
Potassium: 4.7 mmol/L (ref 3.5–5.1)
Sodium: 140 mmol/L (ref 135–145)

## 2023-12-12 ENCOUNTER — Encounter (HOSPITAL_COMMUNITY): Payer: Self-pay

## 2023-12-14 ENCOUNTER — Ambulatory Visit (HOSPITAL_COMMUNITY)
Admission: RE | Admit: 2023-12-14 | Discharge: 2023-12-14 | Disposition: A | Source: Ambulatory Visit | Attending: Internal Medicine

## 2023-12-14 DIAGNOSIS — I493 Ventricular premature depolarization: Secondary | ICD-10-CM | POA: Diagnosis not present

## 2023-12-14 MED ORDER — NITROGLYCERIN 0.4 MG SL SUBL
0.8000 mg | SUBLINGUAL_TABLET | Freq: Once | SUBLINGUAL | Status: AC
  Administered 2023-12-14: 0.8 mg via SUBLINGUAL

## 2023-12-14 MED ORDER — IOHEXOL 350 MG/ML SOLN
100.0000 mL | Freq: Once | INTRAVENOUS | Status: AC | PRN
  Administered 2023-12-14: 100 mL via INTRAVENOUS

## 2023-12-14 MED ORDER — METOPROLOL TARTRATE 5 MG/5ML IV SOLN
10.0000 mg | Freq: Once | INTRAVENOUS | Status: AC | PRN
  Administered 2023-12-14: 10 mg via INTRAVENOUS

## 2023-12-14 MED ORDER — DILTIAZEM HCL 25 MG/5ML IV SOLN
10.0000 mg | INTRAVENOUS | Status: DC | PRN
  Administered 2023-12-14: 10 mg via INTRAVENOUS

## 2023-12-24 ENCOUNTER — Other Ambulatory Visit (HOSPITAL_COMMUNITY): Payer: Self-pay

## 2023-12-25 ENCOUNTER — Other Ambulatory Visit: Payer: Self-pay

## 2023-12-28 ENCOUNTER — Ambulatory Visit: Payer: Self-pay | Admitting: Internal Medicine

## 2024-01-22 ENCOUNTER — Other Ambulatory Visit: Payer: Self-pay

## 2024-02-04 ENCOUNTER — Other Ambulatory Visit (HOSPITAL_COMMUNITY): Payer: Self-pay

## 2024-02-08 ENCOUNTER — Other Ambulatory Visit (HOSPITAL_COMMUNITY): Payer: Self-pay

## 2024-02-08 ENCOUNTER — Encounter: Payer: Self-pay | Admitting: Internal Medicine

## 2024-02-08 ENCOUNTER — Ambulatory Visit: Payer: Self-pay | Admitting: Internal Medicine

## 2024-02-08 ENCOUNTER — Other Ambulatory Visit: Payer: Self-pay

## 2024-02-08 MED ORDER — RYBELSUS 7 MG PO TABS
7.0000 mg | ORAL_TABLET | Freq: Every day | ORAL | 1 refills | Status: AC
  Filled 2024-02-08: qty 90, 90d supply, fill #0

## 2024-02-08 MED ORDER — METFORMIN HCL ER 500 MG PO TB24
500.0000 mg | ORAL_TABLET | Freq: Two times a day (BID) | ORAL | 1 refills | Status: AC
  Filled 2024-02-08: qty 200, 100d supply, fill #0

## 2024-02-25 ENCOUNTER — Other Ambulatory Visit

## 2024-03-05 ENCOUNTER — Ambulatory Visit: Admitting: Urology
